# Patient Record
Sex: Female | Born: 1945
Health system: Southern US, Community
[De-identification: ages and names within clinical notes are randomized; demographics above are authoritative.]

## PROBLEM LIST (undated history)

## (undated) DIAGNOSIS — J3081 Allergic rhinitis due to animal (cat) (dog) hair and dander: Secondary | ICD-10-CM

## (undated) DIAGNOSIS — R42 Dizziness and giddiness: Secondary | ICD-10-CM

## (undated) DIAGNOSIS — L719 Rosacea, unspecified: Secondary | ICD-10-CM

## (undated) DIAGNOSIS — C50911 Malignant neoplasm of unspecified site of right female breast: Secondary | ICD-10-CM

## (undated) DIAGNOSIS — M25512 Pain in left shoulder: Secondary | ICD-10-CM

## (undated) DIAGNOSIS — I480 Paroxysmal atrial fibrillation: Secondary | ICD-10-CM

## (undated) DIAGNOSIS — L659 Nonscarring hair loss, unspecified: Secondary | ICD-10-CM

## (undated) DIAGNOSIS — Z87442 Personal history of urinary calculi: Secondary | ICD-10-CM

## (undated) DIAGNOSIS — D179 Benign lipomatous neoplasm, unspecified: Secondary | ICD-10-CM

## (undated) DIAGNOSIS — IMO0002 Reserved for concepts with insufficient information to code with codable children: Secondary | ICD-10-CM

## (undated) DIAGNOSIS — I499 Cardiac arrhythmia, unspecified: Secondary | ICD-10-CM

## (undated) DIAGNOSIS — R87619 Unspecified abnormal cytological findings in specimens from cervix uteri: Secondary | ICD-10-CM

## (undated) DIAGNOSIS — I483 Typical atrial flutter: Secondary | ICD-10-CM

## (undated) DIAGNOSIS — M1990 Unspecified osteoarthritis, unspecified site: Secondary | ICD-10-CM

## (undated) HISTORY — DX: Unspecified abnormal cytological findings in specimens from cervix uteri: R87.619

## (undated) HISTORY — DX: Unspecified osteoarthritis, unspecified site: M19.90

## (undated) HISTORY — DX: Benign lipomatous neoplasm, unspecified: D17.9

## (undated) HISTORY — DX: Pain in left shoulder: M25.512

## (undated) HISTORY — PX: COLONOSCOPY: SHX174

## (undated) HISTORY — DX: Rosacea, unspecified: L71.9

## (undated) HISTORY — DX: Dizziness and giddiness: R42

## (undated) HISTORY — DX: Allergic rhinitis due to animal (cat) (dog) hair and dander: J30.81

## (undated) HISTORY — DX: Malignant neoplasm of unspecified site of right female breast: C50.911

## (undated) HISTORY — DX: Nonscarring hair loss, unspecified: L65.9

## (undated) HISTORY — DX: Paroxysmal atrial fibrillation: I48.0

## (undated) HISTORY — DX: Reserved for concepts with insufficient information to code with codable children: IMO0002

---

## 1986-08-17 HISTORY — PX: GYNECOLOGIC CRYOSURGERY: SHX857

## 1999-10-29 ENCOUNTER — Encounter: Admission: RE | Admit: 1999-10-29 | Discharge: 1999-10-29 | Payer: Self-pay | Admitting: Obstetrics and Gynecology

## 1999-10-29 ENCOUNTER — Encounter: Payer: Self-pay | Admitting: Obstetrics and Gynecology

## 2000-04-13 ENCOUNTER — Other Ambulatory Visit: Admission: RE | Admit: 2000-04-13 | Discharge: 2000-04-13 | Payer: Self-pay | Admitting: Obstetrics and Gynecology

## 2000-04-13 ENCOUNTER — Encounter (INDEPENDENT_AMBULATORY_CARE_PROVIDER_SITE_OTHER): Payer: Self-pay | Admitting: Specialist

## 2000-12-01 ENCOUNTER — Encounter: Payer: Self-pay | Admitting: Obstetrics and Gynecology

## 2000-12-01 ENCOUNTER — Encounter: Admission: RE | Admit: 2000-12-01 | Discharge: 2000-12-01 | Payer: Self-pay | Admitting: Obstetrics and Gynecology

## 2001-12-07 ENCOUNTER — Encounter: Admission: RE | Admit: 2001-12-07 | Discharge: 2001-12-07 | Payer: Self-pay | Admitting: Obstetrics and Gynecology

## 2001-12-07 ENCOUNTER — Encounter: Payer: Self-pay | Admitting: Obstetrics and Gynecology

## 2002-12-13 ENCOUNTER — Encounter: Admission: RE | Admit: 2002-12-13 | Discharge: 2002-12-13 | Payer: Self-pay | Admitting: Obstetrics and Gynecology

## 2002-12-13 ENCOUNTER — Encounter: Payer: Self-pay | Admitting: Obstetrics and Gynecology

## 2003-12-17 ENCOUNTER — Encounter: Admission: RE | Admit: 2003-12-17 | Discharge: 2003-12-17 | Payer: Self-pay | Admitting: Obstetrics and Gynecology

## 2003-12-20 ENCOUNTER — Encounter: Admission: RE | Admit: 2003-12-20 | Discharge: 2003-12-20 | Payer: Self-pay | Admitting: Obstetrics and Gynecology

## 2003-12-31 ENCOUNTER — Encounter (INDEPENDENT_AMBULATORY_CARE_PROVIDER_SITE_OTHER): Payer: Self-pay | Admitting: *Deleted

## 2003-12-31 ENCOUNTER — Encounter: Admission: RE | Admit: 2003-12-31 | Discharge: 2003-12-31 | Payer: Self-pay | Admitting: Surgery

## 2003-12-31 ENCOUNTER — Encounter (INDEPENDENT_AMBULATORY_CARE_PROVIDER_SITE_OTHER): Payer: Self-pay | Admitting: Radiology

## 2004-01-07 ENCOUNTER — Encounter: Admission: RE | Admit: 2004-01-07 | Discharge: 2004-01-07 | Payer: Self-pay | Admitting: Surgery

## 2004-01-08 ENCOUNTER — Encounter (HOSPITAL_COMMUNITY): Admission: RE | Admit: 2004-01-08 | Discharge: 2004-04-07 | Payer: Self-pay | Admitting: Obstetrics and Gynecology

## 2004-01-10 ENCOUNTER — Encounter (INDEPENDENT_AMBULATORY_CARE_PROVIDER_SITE_OTHER): Payer: Self-pay | Admitting: *Deleted

## 2004-01-10 ENCOUNTER — Ambulatory Visit (HOSPITAL_COMMUNITY): Admission: RE | Admit: 2004-01-10 | Discharge: 2004-01-10 | Payer: Self-pay | Admitting: Surgery

## 2004-01-10 HISTORY — PX: BREAST LUMPECTOMY: SHX2

## 2004-01-30 ENCOUNTER — Ambulatory Visit: Admission: RE | Admit: 2004-01-30 | Discharge: 2004-04-29 | Payer: Self-pay | Admitting: Radiation Oncology

## 2004-04-11 DIAGNOSIS — C50911 Malignant neoplasm of unspecified site of right female breast: Secondary | ICD-10-CM

## 2004-04-11 HISTORY — DX: Malignant neoplasm of unspecified site of right female breast: C50.911

## 2004-06-05 ENCOUNTER — Ambulatory Visit: Admission: RE | Admit: 2004-06-05 | Discharge: 2004-06-05 | Payer: Self-pay | Admitting: Radiation Oncology

## 2004-06-27 ENCOUNTER — Ambulatory Visit (HOSPITAL_COMMUNITY): Admission: RE | Admit: 2004-06-27 | Discharge: 2004-06-27 | Payer: Self-pay | Admitting: Surgery

## 2004-07-04 ENCOUNTER — Ambulatory Visit: Payer: Self-pay | Admitting: Oncology

## 2004-11-10 ENCOUNTER — Ambulatory Visit: Payer: Self-pay | Admitting: Oncology

## 2004-12-17 ENCOUNTER — Encounter: Admission: RE | Admit: 2004-12-17 | Discharge: 2004-12-17 | Payer: Self-pay | Admitting: Oncology

## 2005-05-05 ENCOUNTER — Ambulatory Visit: Payer: Self-pay | Admitting: Oncology

## 2005-10-30 ENCOUNTER — Ambulatory Visit: Payer: Self-pay | Admitting: Oncology

## 2005-12-21 ENCOUNTER — Encounter: Admission: RE | Admit: 2005-12-21 | Discharge: 2005-12-21 | Payer: Self-pay | Admitting: Oncology

## 2006-03-01 ENCOUNTER — Encounter: Admission: RE | Admit: 2006-03-01 | Discharge: 2006-03-01 | Payer: Self-pay | Admitting: Oncology

## 2006-04-29 ENCOUNTER — Ambulatory Visit: Payer: Self-pay | Admitting: Oncology

## 2006-05-11 LAB — COMPREHENSIVE METABOLIC PANEL
ALT: 12 U/L (ref 0–40)
AST: 14 U/L (ref 0–37)
CO2: 31 mEq/L (ref 19–32)
Chloride: 101 mEq/L (ref 96–112)
Creatinine, Ser: 0.87 mg/dL (ref 0.40–1.20)
Sodium: 140 mEq/L (ref 135–145)
Total Bilirubin: 0.6 mg/dL (ref 0.3–1.2)
Total Protein: 7 g/dL (ref 6.0–8.3)

## 2006-05-11 LAB — CANCER ANTIGEN 27.29: CA 27.29: 12 U/mL (ref 0–39)

## 2006-05-11 LAB — CBC WITH DIFFERENTIAL/PLATELET
BASO%: 0.5 % (ref 0.0–2.0)
Eosinophils Absolute: 0.2 10*3/uL (ref 0.0–0.5)
MCHC: 34.2 g/dL (ref 32.0–36.0)
MONO#: 0.7 10*3/uL (ref 0.1–0.9)
NEUT#: 3.3 10*3/uL (ref 1.5–6.5)
RBC: 4.48 10*6/uL (ref 3.70–5.32)
RDW: 13.1 % (ref 11.3–14.5)
WBC: 6.1 10*3/uL (ref 3.9–10.0)

## 2006-12-16 ENCOUNTER — Encounter: Admission: RE | Admit: 2006-12-16 | Discharge: 2006-12-16 | Payer: Self-pay | Admitting: Oncology

## 2006-12-29 ENCOUNTER — Encounter: Admission: RE | Admit: 2006-12-29 | Discharge: 2006-12-29 | Payer: Self-pay | Admitting: Oncology

## 2006-12-31 ENCOUNTER — Ambulatory Visit: Payer: Self-pay | Admitting: Oncology

## 2007-01-06 LAB — CBC WITH DIFFERENTIAL/PLATELET
Basophils Absolute: 0 10*3/uL (ref 0.0–0.1)
EOS%: 3.5 % (ref 0.0–7.0)
Eosinophils Absolute: 0.2 10*3/uL (ref 0.0–0.5)
HGB: 14.3 g/dL (ref 11.6–15.9)
LYMPH%: 29.1 % (ref 14.0–48.0)
MCH: 31 pg (ref 26.0–34.0)
MCV: 88.5 fL (ref 81.0–101.0)
MONO%: 9.6 % (ref 0.0–13.0)
NEUT#: 3.7 10*3/uL (ref 1.5–6.5)
Platelets: 173 10*3/uL (ref 145–400)
RBC: 4.6 10*6/uL (ref 3.70–5.32)

## 2007-01-06 LAB — COMPREHENSIVE METABOLIC PANEL
Alkaline Phosphatase: 89 U/L (ref 39–117)
CO2: 28 mEq/L (ref 19–32)
Creatinine, Ser: 0.79 mg/dL (ref 0.40–1.20)
Glucose, Bld: 101 mg/dL — ABNORMAL HIGH (ref 70–99)
Total Bilirubin: 0.5 mg/dL (ref 0.3–1.2)

## 2007-05-03 ENCOUNTER — Ambulatory Visit: Payer: Self-pay | Admitting: Oncology

## 2007-05-05 LAB — COMPREHENSIVE METABOLIC PANEL
ALT: 16 U/L (ref 0–35)
AST: 17 U/L (ref 0–37)
Albumin: 4.6 g/dL (ref 3.5–5.2)
Alkaline Phosphatase: 87 U/L (ref 39–117)
BUN: 17 mg/dL (ref 6–23)
CO2: 29 mEq/L (ref 19–32)
Calcium: 9.8 mg/dL (ref 8.4–10.5)
Glucose, Bld: 82 mg/dL (ref 70–99)
Sodium: 141 mEq/L (ref 135–145)
Total Bilirubin: 0.6 mg/dL (ref 0.3–1.2)

## 2007-05-05 LAB — CBC WITH DIFFERENTIAL/PLATELET
Basophils Absolute: 0 10*3/uL (ref 0.0–0.1)
Eosinophils Absolute: 0.2 10*3/uL (ref 0.0–0.5)
HCT: 41.1 % (ref 34.8–46.6)
HGB: 14.1 g/dL (ref 11.6–15.9)
LYMPH%: 31.3 % (ref 14.0–48.0)
MONO#: 0.6 10*3/uL (ref 0.1–0.9)
NEUT#: 3.5 10*3/uL (ref 1.5–6.5)
NEUT%: 55.5 % (ref 39.6–76.8)
Platelets: 170 10*3/uL (ref 145–400)
WBC: 6.3 10*3/uL (ref 3.9–10.0)

## 2007-05-05 LAB — CANCER ANTIGEN 27.29: CA 27.29: 11 U/mL (ref 0–39)

## 2007-05-12 LAB — ESTRADIOL, ULTRA SENS: Estradiol, Ultra Sensitive: 9 pg/mL

## 2007-11-02 ENCOUNTER — Ambulatory Visit: Payer: Self-pay | Admitting: Oncology

## 2007-11-02 LAB — CBC WITH DIFFERENTIAL/PLATELET
BASO%: 0.7 % (ref 0.0–2.0)
Eosinophils Absolute: 0.2 10*3/uL (ref 0.0–0.5)
LYMPH%: 33.4 % (ref 14.0–48.0)
MCHC: 34.3 g/dL (ref 32.0–36.0)
MONO#: 0.7 10*3/uL (ref 0.1–0.9)
NEUT#: 3.1 10*3/uL (ref 1.5–6.5)
Platelets: 178 10*3/uL (ref 145–400)
RBC: 4.61 10*6/uL (ref 3.70–5.32)
WBC: 6 10*3/uL (ref 3.9–10.0)
lymph#: 2 10*3/uL (ref 0.9–3.3)

## 2007-11-03 LAB — COMPREHENSIVE METABOLIC PANEL
ALT: 15 U/L (ref 0–35)
Albumin: 4.6 g/dL (ref 3.5–5.2)
CO2: 29 mEq/L (ref 19–32)
Calcium: 9.8 mg/dL (ref 8.4–10.5)
Chloride: 101 mEq/L (ref 96–112)
Glucose, Bld: 74 mg/dL (ref 70–99)
Potassium: 4.2 mEq/L (ref 3.5–5.3)
Sodium: 139 mEq/L (ref 135–145)
Total Protein: 7.4 g/dL (ref 6.0–8.3)

## 2007-11-03 LAB — CANCER ANTIGEN 27.29: CA 27.29: 15 U/mL (ref 0–39)

## 2007-11-03 LAB — VITAMIN D 25 HYDROXY (VIT D DEFICIENCY, FRACTURES): Vit D, 25-Hydroxy: 37 ng/mL (ref 30–89)

## 2007-11-09 LAB — VITAMIN D 1,25 DIHYDROXY: Vit D, 1,25-Dihydroxy: 62 pg/mL (ref 6–62)

## 2007-12-08 ENCOUNTER — Other Ambulatory Visit: Admission: RE | Admit: 2007-12-08 | Discharge: 2007-12-08 | Payer: Self-pay | Admitting: Obstetrics & Gynecology

## 2008-01-02 ENCOUNTER — Encounter: Admission: RE | Admit: 2008-01-02 | Discharge: 2008-01-02 | Payer: Self-pay | Admitting: Obstetrics & Gynecology

## 2008-04-30 ENCOUNTER — Ambulatory Visit: Payer: Self-pay | Admitting: Oncology

## 2008-05-02 LAB — COMPREHENSIVE METABOLIC PANEL
Albumin: 4.3 g/dL (ref 3.5–5.2)
Alkaline Phosphatase: 90 U/L (ref 39–117)
BUN: 20 mg/dL (ref 6–23)
CO2: 28 mEq/L (ref 19–32)
Glucose, Bld: 100 mg/dL — ABNORMAL HIGH (ref 70–99)
Potassium: 4 mEq/L (ref 3.5–5.3)
Total Bilirubin: 0.4 mg/dL (ref 0.3–1.2)

## 2008-05-02 LAB — CANCER ANTIGEN 27.29: CA 27.29: 15 U/mL (ref 0–39)

## 2008-05-02 LAB — CBC WITH DIFFERENTIAL/PLATELET
Basophils Absolute: 0 10*3/uL (ref 0.0–0.1)
Eosinophils Absolute: 0.2 10*3/uL (ref 0.0–0.5)
HGB: 13.6 g/dL (ref 11.6–15.9)
LYMPH%: 29.6 % (ref 14.0–48.0)
MCV: 90.5 fL (ref 81.0–101.0)
MONO#: 0.6 10*3/uL (ref 0.1–0.9)
MONO%: 9.1 % (ref 0.0–13.0)
NEUT#: 3.5 10*3/uL (ref 1.5–6.5)
Platelets: 171 10*3/uL (ref 145–400)

## 2008-10-29 ENCOUNTER — Ambulatory Visit: Payer: Self-pay | Admitting: Oncology

## 2008-10-31 LAB — CBC WITH DIFFERENTIAL/PLATELET
BASO%: 0.3 % (ref 0.0–2.0)
Basophils Absolute: 0 10*3/uL (ref 0.0–0.1)
EOS%: 2.2 % (ref 0.0–7.0)
HGB: 14.5 g/dL (ref 11.6–15.9)
MCH: 31 pg (ref 25.1–34.0)
MCHC: 34.5 g/dL (ref 31.5–36.0)
RBC: 4.67 10*6/uL (ref 3.70–5.45)
RDW: 12.2 % (ref 11.2–14.5)
lymph#: 2.1 10*3/uL (ref 0.9–3.3)

## 2008-10-31 LAB — COMPREHENSIVE METABOLIC PANEL
ALT: 19 U/L (ref 0–35)
AST: 21 U/L (ref 0–37)
Albumin: 4.3 g/dL (ref 3.5–5.2)
Calcium: 9.7 mg/dL (ref 8.4–10.5)
Chloride: 101 mEq/L (ref 96–112)
Potassium: 4.2 mEq/L (ref 3.5–5.3)
Sodium: 139 mEq/L (ref 135–145)
Total Protein: 7.2 g/dL (ref 6.0–8.3)

## 2009-01-02 ENCOUNTER — Encounter: Admission: RE | Admit: 2009-01-02 | Discharge: 2009-01-02 | Payer: Self-pay | Admitting: Obstetrics & Gynecology

## 2009-01-18 ENCOUNTER — Encounter: Admission: RE | Admit: 2009-01-18 | Discharge: 2009-01-18 | Payer: Self-pay | Admitting: Obstetrics & Gynecology

## 2009-04-19 ENCOUNTER — Ambulatory Visit: Payer: Self-pay | Admitting: Oncology

## 2009-04-24 LAB — CBC WITH DIFFERENTIAL/PLATELET
Basophils Absolute: 0 10*3/uL (ref 0.0–0.1)
EOS%: 2.9 % (ref 0.0–7.0)
Eosinophils Absolute: 0.2 10*3/uL (ref 0.0–0.5)
HGB: 14.2 g/dL (ref 11.6–15.9)
LYMPH%: 27.3 % (ref 14.0–49.7)
MCV: 90.6 fL (ref 79.5–101.0)
MONO#: 0.6 10*3/uL (ref 0.1–0.9)
MONO%: 8.7 % (ref 0.0–14.0)
Platelets: 169 10*3/uL (ref 145–400)
WBC: 6.4 10*3/uL (ref 3.9–10.3)

## 2009-04-24 LAB — COMPREHENSIVE METABOLIC PANEL
CO2: 30 mEq/L (ref 19–32)
Glucose, Bld: 90 mg/dL (ref 70–99)
Sodium: 138 mEq/L (ref 135–145)
Total Bilirubin: 0.7 mg/dL (ref 0.3–1.2)
Total Protein: 7.5 g/dL (ref 6.0–8.3)

## 2009-04-25 LAB — CANCER ANTIGEN 19-9: CA 19-9: 13.7 U/mL (ref <35.0)

## 2009-04-25 LAB — VITAMIN D 25 HYDROXY (VIT D DEFICIENCY, FRACTURES): Vit D, 25-Hydroxy: 45 ng/mL (ref 30–89)

## 2010-01-06 ENCOUNTER — Encounter: Admission: RE | Admit: 2010-01-06 | Discharge: 2010-01-06 | Payer: Self-pay | Admitting: Oncology

## 2010-01-07 ENCOUNTER — Ambulatory Visit (HOSPITAL_COMMUNITY): Admission: RE | Admit: 2010-01-07 | Discharge: 2010-01-07 | Payer: Self-pay | Admitting: Oncology

## 2010-04-23 ENCOUNTER — Ambulatory Visit: Payer: Self-pay | Admitting: Oncology

## 2010-04-23 LAB — CBC WITH DIFFERENTIAL/PLATELET
BASO%: 1.1 % (ref 0.0–2.0)
Basophils Absolute: 0.1 10*3/uL (ref 0.0–0.1)
EOS%: 3.3 % (ref 0.0–7.0)
HCT: 39.8 % (ref 34.8–46.6)
HGB: 13.3 g/dL (ref 11.6–15.9)
LYMPH%: 29.1 % (ref 14.0–49.7)
MCV: 91.8 fL (ref 79.5–101.0)
NEUT#: 3.1 10*3/uL (ref 1.5–6.5)
NEUT%: 58.1 % (ref 38.4–76.8)
RBC: 4.34 10*6/uL (ref 3.70–5.45)
RDW: 13.2 % (ref 11.2–14.5)
WBC: 5.3 10*3/uL (ref 3.9–10.3)

## 2010-04-23 LAB — COMPREHENSIVE METABOLIC PANEL
AST: 20 U/L (ref 0–37)
Albumin: 3.8 g/dL (ref 3.5–5.2)
Alkaline Phosphatase: 76 U/L (ref 39–117)
Calcium: 9.4 mg/dL (ref 8.4–10.5)
Chloride: 105 mEq/L (ref 96–112)
Sodium: 142 mEq/L (ref 135–145)
Total Protein: 6.7 g/dL (ref 6.0–8.3)

## 2010-04-24 LAB — VITAMIN D 25 HYDROXY (VIT D DEFICIENCY, FRACTURES): Vit D, 25-Hydroxy: 34 ng/mL (ref 30–89)

## 2010-09-06 ENCOUNTER — Other Ambulatory Visit: Payer: Self-pay | Admitting: Oncology

## 2010-09-06 DIAGNOSIS — Z780 Asymptomatic menopausal state: Secondary | ICD-10-CM

## 2010-09-06 DIAGNOSIS — Z9889 Other specified postprocedural states: Secondary | ICD-10-CM

## 2010-09-06 DIAGNOSIS — Z0000 Encounter for general adult medical examination without abnormal findings: Secondary | ICD-10-CM

## 2010-09-07 ENCOUNTER — Encounter: Payer: Self-pay | Admitting: Oncology

## 2011-01-02 NOTE — Op Note (Signed)
NAME:  Cathy Montoya, Cathy Montoya                         ACCOUNT NO.:  [id number]   MEDICAL RECORD NO.:  [id number]                   PATIENT TYPE:  OIB   LOCATION:  2550                                 FACILITY:  [facility]   PHYSICIAN:  Sandria Bales. Ezzard Standing, M.D.               DATE OF BIRTH:  1946-02-20   DATE OF PROCEDURE:  01/10/2004  DATE OF DISCHARGE:                                 OPERATIVE REPORT   PREOPERATIVE DIAGNOSIS:  Carcinoma of the right breast at 8 o'clock  position.   POSTOPERATIVE DIAGNOSIS:  Carcinoma of the right breast at 8 o'clock  position with negative sentinel lymph node biopsy.   PROCEDURE:  Right partial mastectomy, injection of isosulfan blue, right  sentinel lymph node biopsy.   SURGEON:  Sandria Bales. Ezzard Standing, M.D.   ANESTHESIA:  General LMA.   COMPLICATIONS:  None.   INDICATION FOR PROCEDURE:  Ms. Caccamo is a 65 year old white female who has  biopsy-proven carcinoma of the right breast with the primary tumor at 8  o'clock, approximately 1.6 cm in size.  A discussion carried out with the  patient.  The indications and potential complications of surgery and the  options for treatment breast cancer, including mastectomy with and without  reconstruction, and lumpectomy.   She has elected to undergo a lumpectomy of the right breast, planned  sentinel lymph node biopsy.  She understands the possibility of having a  full axillary node dissection.  The indications and potential complications  were explained to the patient.  The potential complications include but are  not limited to bleeding, infection, nerve injury, lymphedema, and recurrent  of the tumor.   The patient placed in the supine position, had already been to radiology,  where she had her right breast injected with a radioisotope.  I then  injected about 1 mL of isosulfan blue in the subareolar space.  I then used  an ultrasound to again identify the tumor, which was actually palpable at  the 8 o'clock position  of the right breast off the edge of the main breast  mass.  I then prepped her right breast and arm with Betadine solution and  sterilely draped her.  I first went up to the sentinel lymph node, in which  I identified a hot area immediately behind the pectoralis major at the upper  end of the breast tissue, cut down and followed a lymphatic duct into a  pretty good-sized lymph node.  It was probably about 1 x 2 cm, blue, and had  counts of 3000 with a background of about 10.  The sentinel node was sent  off.  Dr. Tammi Sou read touch preps as being negative but did raise the  specter of a possible lobular carcinoma, though he said he could not call it  clearly malignant.   I then turned my attention to the primary tumor.  I tried to make sure I  had  at least a 1 cm margin on all sides.  The tumor, again, was at the 8 o'clock  position.  I got an ellipse of skin.  I tried to get 1-2 cm around the  entire mass.  I took out a pretty good piece of her lower outer aspect of  her left breast.  I carried my dissection down to the chest wall, so that  was my deep margin.  I marked the specimen inferiorly with a marker,  medially with a marker, superiorly with a marker.  Touch preps by Dr. Debby Bud  said there was no gross tumor.  He thought my closest margin was actually my  deep margin, which was approximately a centimeter in size.  I then irrigated  both wounds out.  I marked the cavity with baby blue clips.  I then closed  the skin with interrupted 3-0 Vicryl sutures and then 5-0 Vicryl sutures in  the skin, painted each wound with tincture of Benzoin and steri-stripped  them.   The patient tolerated the procedure well, was transported to the recovery  room in good condition.  Sponge and needle count were correct at the end of  the case.  Again, she had a negative sentinel lymph node and negative  margins.  Final pathology is pending at the time of dictation.                                                Sandria Bales. Ezzard Standing, M.D.    DHN/MEDQ  D:  01/10/2004  T:  01/11/2004  Job:  [id number]   cc:   Doristine Mango, M.D.   Katherine Roan, M.D.  [address]  [place]  [place] [address]  Fax: [phone]   Valentino Hue. Magrinat, M.D.  [address] [facility]  [place]  [place] [address]  Fax: [phone]   Cheral Marker, M.D.

## 2011-01-02 NOTE — Op Note (Signed)
NAME:  Cathy Montoya, Cathy Montoya NO.:  [id number]   MEDICAL RECORD NO.:  [id number]          PATIENT TYPE:  AMB   LOCATION:  ENDO                         FACILITY:  [facility]   PHYSICIAN:  Sandria Bales. Ezzard Standing, M.D.  DATE OF BIRTH:  1945-10-15   DATE OF PROCEDURE:  06/27/2004  DATE OF DISCHARGE:                                 OPERATIVE REPORT   PREOPERATIVE DIAGNOSIS:  History of breast cancer, age > 84, for evaluation  of colon.   POSTOPERATIVE DIAGNOSIS:  Normal colon without polyps, growth, or  diverticula.   PROCEDURE:  Flexible colonoscopy.   SURGEON:  Sandria Bales. Ezzard Standing, M.D.  No first assistant.   ANESTHESIA:  5 mg of Versed, 50 mg of Demerol.   COMPLICATIONS:  None.   INDICATION FOR PROCEDURE:  [name] is a 65 year old white female who has  had a right breast lumpectomy for breast cancer.  Because of her age and her  history of breast cancer, she now comes for screening colonoscopy.   The indications and potential complications of the procedure were explained  to the patient.  Potential complications include but not limited to bowel  perforation, bleeding.  She completed a bowel prep at home; however, last  night she ate salad because she had not been told she should not eat any  other liquids and I was unsure at the beginning of the procedure whether  this would compromise examination of the lumen of her bowel or not.   She was placed in a left lateral decubitus position.  She had an IV in her  right hand.  She was given nasal oxygen, had an EKG in place, blood pressure  cuff, and pulse oximeter during the procedure.   OPERATIVE NOTE:  The pediatric Olympus scope was advanced without difficulty  through her sigmoid colon, left colon, and transverse colon.  At the hepatic  flexure I had a little trouble advancing the scope and had to roll her both  on her back and on her right side but did get the scope to advance into the  cecum.  I visualized the appendiceal  orifice.  She did have a stool ball in  her cecum, but I was able to roll her around enough to see around this well  enough.  She had some fluid in the remainder of her colon but despite having  eaten last night, I would say her bowel prep was good enough that I saw the  lumen of the bowel well in both right colon, transverse colon, left colon,  and sigmoid colon and rectum.   The scope was then retroflexed with in the rectum, and I see she had maybe a  small internal hemorrhoids but no other mass or lesion, and digital rectal  exam was also unremarkable.   [name] basically had a normal colonoscopy.  Unless she should have some  change in her bowel habits or something other significant, probably will not  need another colonoscopy for 10 years' time.      Davi   DHN/MEDQ  D:  06/27/2004  T:  06/28/2004  Job:  [id number]   cc:   S. Kyra Manges, M.D.  [phone] [address]  [place]  [place] [address]  Fax: [phone]   Valentino Hue. Magrinat, M.D.  [address] [facility]  [place]  [place] [address]  Fax: [phone]   Maryln Gottron, M.D.  [address] [facility]  [place]  [place] [address]  Fax: [phone]

## 2011-01-08 ENCOUNTER — Ambulatory Visit
Admission: RE | Admit: 2011-01-08 | Discharge: 2011-01-08 | Disposition: A | Discharge status: Home / Self Care | Payer: BC Managed Care – PPO | Source: Ambulatory Visit | Attending: Oncology | Admitting: Oncology

## 2011-01-08 ENCOUNTER — Other Ambulatory Visit: Payer: Self-pay

## 2011-01-08 DIAGNOSIS — Z9889 Other specified postprocedural states: Secondary | ICD-10-CM

## 2011-01-09 ENCOUNTER — Other Ambulatory Visit: Payer: Self-pay

## 2011-01-09 ENCOUNTER — Ambulatory Visit
Admission: RE | Admit: 2011-01-09 | Discharge: 2011-01-09 | Disposition: A | Discharge status: Home / Self Care | Payer: BC Managed Care – PPO | Source: Ambulatory Visit | Attending: Oncology | Admitting: Oncology

## 2011-01-09 DIAGNOSIS — Z780 Asymptomatic menopausal state: Secondary | ICD-10-CM

## 2011-01-15 ENCOUNTER — Other Ambulatory Visit: Payer: Self-pay | Admitting: Dermatology

## 2011-03-06 ENCOUNTER — Other Ambulatory Visit: Payer: Self-pay | Admitting: Dermatology

## 2011-05-05 ENCOUNTER — Other Ambulatory Visit: Payer: Self-pay | Admitting: Oncology

## 2011-05-05 ENCOUNTER — Encounter (HOSPITAL_BASED_OUTPATIENT_CLINIC_OR_DEPARTMENT_OTHER): Payer: BC Managed Care – PPO | Admitting: Oncology

## 2011-05-05 DIAGNOSIS — C50419 Malignant neoplasm of upper-outer quadrant of unspecified female breast: Secondary | ICD-10-CM

## 2011-05-05 DIAGNOSIS — Z170 Estrogen receptor positive status [ER+]: Secondary | ICD-10-CM

## 2011-05-05 LAB — COMPREHENSIVE METABOLIC PANEL
AST: 17 U/L (ref 0–37)
Albumin: 3.6 g/dL (ref 3.5–5.2)
BUN: 23 mg/dL (ref 6–23)
Calcium: 9.8 mg/dL (ref 8.4–10.5)
Chloride: 101 mEq/L (ref 96–112)
Glucose, Bld: 97 mg/dL (ref 70–99)
Potassium: 3.9 mEq/L (ref 3.5–5.3)
Sodium: 139 mEq/L (ref 135–145)
Total Protein: 7.1 g/dL (ref 6.0–8.3)

## 2011-05-05 LAB — CBC WITH DIFFERENTIAL/PLATELET
Basophils Absolute: 0 10*3/uL (ref 0.0–0.1)
Eosinophils Absolute: 0.2 10*3/uL (ref 0.0–0.5)
HGB: 14 g/dL (ref 11.6–15.9)
NEUT#: 3.7 10*3/uL (ref 1.5–6.5)
RBC: 4.53 10*6/uL (ref 3.70–5.45)
RDW: 13.2 % (ref 11.2–14.5)
WBC: 6.2 10*3/uL (ref 3.9–10.3)
lymph#: 1.7 10*3/uL (ref 0.9–3.3)

## 2011-05-05 LAB — VITAMIN D 25 HYDROXY (VIT D DEFICIENCY, FRACTURES): Vit D, 25-Hydroxy: 38 ng/mL (ref 30–89)

## 2011-05-12 ENCOUNTER — Other Ambulatory Visit: Payer: Self-pay | Admitting: Oncology

## 2011-05-12 ENCOUNTER — Encounter (HOSPITAL_BASED_OUTPATIENT_CLINIC_OR_DEPARTMENT_OTHER): Payer: BC Managed Care – PPO | Admitting: Oncology

## 2011-05-12 DIAGNOSIS — C50419 Malignant neoplasm of upper-outer quadrant of unspecified female breast: Secondary | ICD-10-CM

## 2011-05-12 DIAGNOSIS — Z170 Estrogen receptor positive status [ER+]: Secondary | ICD-10-CM

## 2011-05-12 DIAGNOSIS — M899 Disorder of bone, unspecified: Secondary | ICD-10-CM

## 2011-05-12 DIAGNOSIS — Z1231 Encounter for screening mammogram for malignant neoplasm of breast: Secondary | ICD-10-CM

## 2012-01-11 ENCOUNTER — Ambulatory Visit: Payer: BC Managed Care – PPO

## 2012-01-12 ENCOUNTER — Ambulatory Visit
Admission: RE | Admit: 2012-01-12 | Discharge: 2012-01-12 | Disposition: A | Discharge status: Home / Self Care | Payer: Medicare Other | Source: Ambulatory Visit | Attending: Oncology | Admitting: Oncology

## 2012-01-12 DIAGNOSIS — Z1231 Encounter for screening mammogram for malignant neoplasm of breast: Secondary | ICD-10-CM

## 2012-04-16 ENCOUNTER — Telehealth: Payer: Self-pay | Admitting: Oncology

## 2012-04-16 NOTE — Telephone Encounter (Signed)
S/w pt husband re appts for 10/2 and 10/9.

## 2012-05-12 ENCOUNTER — Telehealth: Payer: Self-pay | Admitting: Oncology

## 2012-05-12 NOTE — Telephone Encounter (Signed)
lmonvm advisng the pt of her cancelled oct appts that have been r/s to nov due to a change in the md's schedule

## 2012-05-18 ENCOUNTER — Other Ambulatory Visit: Payer: Medicare Other

## 2012-05-25 ENCOUNTER — Ambulatory Visit: Payer: Medicare Other | Admitting: Oncology

## 2012-07-11 ENCOUNTER — Other Ambulatory Visit (HOSPITAL_BASED_OUTPATIENT_CLINIC_OR_DEPARTMENT_OTHER): Payer: 59 | Admitting: Lab

## 2012-07-11 DIAGNOSIS — C50419 Malignant neoplasm of upper-outer quadrant of unspecified female breast: Secondary | ICD-10-CM

## 2012-07-11 LAB — COMPREHENSIVE METABOLIC PANEL (CC13)
BUN: 19 mg/dL (ref 7.0–26.0)
CO2: 31 mEq/L — ABNORMAL HIGH (ref 22–29)
Calcium: 10.2 mg/dL (ref 8.4–10.4)
Chloride: 104 mEq/L (ref 98–107)
Creatinine: 0.8 mg/dL (ref 0.6–1.1)
Glucose: 91 mg/dl (ref 70–99)

## 2012-07-11 LAB — CBC WITH DIFFERENTIAL/PLATELET
Basophils Absolute: 0.1 10*3/uL (ref 0.0–0.1)
HCT: 43.2 % (ref 34.8–46.6)
HGB: 14.5 g/dL (ref 11.6–15.9)
LYMPH%: 27.5 % (ref 14.0–49.7)
MONO#: 0.7 10*3/uL (ref 0.1–0.9)
NEUT%: 56.5 % (ref 38.4–76.8)
Platelets: 171 10*3/uL (ref 145–400)
WBC: 6.1 10*3/uL (ref 3.9–10.3)
lymph#: 1.7 10*3/uL (ref 0.9–3.3)

## 2012-07-11 LAB — CANCER ANTIGEN 27.29: CA 27.29: 16 U/mL (ref 0–39)

## 2012-07-18 ENCOUNTER — Ambulatory Visit: Payer: Medicare Other | Admitting: Oncology

## 2012-07-27 ENCOUNTER — Encounter: Payer: Self-pay | Admitting: Physician Assistant

## 2012-07-27 ENCOUNTER — Ambulatory Visit (HOSPITAL_BASED_OUTPATIENT_CLINIC_OR_DEPARTMENT_OTHER): Payer: 59 | Admitting: Physician Assistant

## 2012-07-27 VITALS — BP 155/77 | HR 64 | Temp 98.0°F | Resp 20 | Wt 158.0 lb

## 2012-07-27 DIAGNOSIS — C50911 Malignant neoplasm of unspecified site of right female breast: Secondary | ICD-10-CM

## 2012-07-27 DIAGNOSIS — Z853 Personal history of malignant neoplasm of breast: Secondary | ICD-10-CM

## 2012-07-27 NOTE — Progress Notes (Signed)
ID: Cathy Montoya   DOB: 18-Jan-1946  MR#: [id number]  CSN#:[id number]  PCP: Cala Bradford, MD GYN: Lum Keas, MD SU:  OTHER MD: Venancio Poisson, MD   HISTORY OF PRESENT ILLNESS: Cathy Montoya had a suspicious breast mass noted on mammogram obtained Dec 17, 2003.  Additional views confirmed the presence of an irregular area of distortion and spiculation and by ultrasound, this measured up to 1.2 cm.  Accordingly, biopsy of this lesion was obtained on 12/31/03 and showed ([id number]) an invasive mammary carcinoma which was ER positive at 15%, PR positive at 23%, HER-2/neu negative at 1+.  With this information, the patient was referred to Dr. Ovidio Kin and after appropriate discussion, she proceeded to right lumpectomy with sentinel lymph node biopsy Jan 10, 2004.  The final pathology ([id number]) showed a 1.8 cm infiltrating lobular carcinoma, with negative margins.  No evidence of lymphovascular invasion.  Zero of one lymph nodes involved, although there were isolated tumor cells in that lymph node.  She underwent one cycle of CMF, then declined additional chemotherapy. She received radiation therapy. She was on exemestane from September of 2005 until September of 2010 according to the MA 27 protocol.  INTERVAL HISTORY: Else returns today for routine followup of her right breast carcinoma. Interval history is generally unremarkable. 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  REVIEW OF SYSTEMS: Physically,[name] had few complaints today. She continues to have some alopecia followed by her dermatologist. She's had no recent illnesses and denies fevers or chills. She has only occasional hot flashes, nothing problematic. No vaginal bleeding and no significant vaginal dryness. She's eating and drinking well with no nausea or change in bowel or bladder habits. She has no cough,  shortness of breath, or chest pain. She denies abnormal headaches and has had no unusual myalgias, arthralgias, bony pain, or peripheral swelling. She has some occasional arthritic pain in the left hip.  A detailed review of systems is otherwise noncontributory.   PAST MEDICAL HISTORY: Past Medical History  Diagnosis Date  . Breast cancer, right 07/27/2012  Significant for a possible diagnosis of juvenile rheumatoid arthritis.  The patient was diagnosed at age 66.  It is really unclear what all this represented.  It may be that she had positive markers at the time, but what she actually had clinically was a severe overall weakness. She was treated with aspirin and "another drug" for one year, and has not required any further interventions.   Other problems include a heart murmur noted during her pregnancy and status post cryotherapy to the cervix remotely.      PAST SURGICAL HISTORY: No past surgical history on file.  FAMILY HISTORY No family history on file. The patient's father is alive at age 80, status post CVA.  The patient's mother is alive at age 41 with possible Alzheimer's.  They both live in [place].  The patient has an older sister with a history of breast cancer, status post mastectomy.  I believe she was diagnosed at age 65.  The patient's paternal grandmother also had breast cancer.  The patient's paternal aunt died of breast cancer in her late sixties and that particular aunt's daughter, the patient's first cousin, was diagnosed with breast cancer in her twenties.  GYNECOLOGIC HISTORY: She is status post change of life, approximately 2003, when she last had a menstrual period.  SOCIAL HISTORY:  She is aretired grade Engineer, site.  Her husband, Cathy Montoya, works as a Human resources officer for a [employer]  They have two daughters, one of whom is a Engineer, civil (consulting) in [place].  The other one is a [employer] in [place].    ADVANCED DIRECTIVES:  HEALTH  MAINTENANCE: History  Substance Use Topics  . Smoking status: Never Smoker   . Smokeless tobacco: Never Used  . Alcohol Use: No     Colonoscopy: UTD  PAP: UTD, Dr. Hyacinth Meeker  Bone density:  01/09/2011, Normal  Lipid panel:  No Known Allergies  Current Outpatient Prescriptions  Medication Sig Dispense Refill  . aspirin 81 MG tablet Take 81 mg by mouth daily.      . Azelaic Acid (FINACEA EX) Apply topically as needed.      . Calcium Carbonate-Vitamin D (CALCIUM-VITAMIN D) 500-200 MG-UNIT per tablet Take 1 tablet by mouth 2 (two) times daily with a meal.      . cholecalciferol (VITAMIN D) 400 UNITS TABS Take 1,000 Units by mouth daily.      . clobetasol cream (TEMOVATE) 0.05 % Apply 1 application topically 2 (two) times daily.      . Multiple Vitamin (MULTIVITAMIN) capsule Take 1 capsule by mouth daily.        OBJECTIVE: Middle-aged white female who appears comfortable and in no acute distress Filed Vitals:   07/27/12 1034  BP: 155/77  Pulse: 64  Temp: 98 F (36.7 C)  Resp: 20     There is no height on file to calculate BMI.    ECOG FS: 0 Filed Weights   07/27/12 1034  Weight: 158 lb (71.668 kg)    Sclerae unicteric Oropharynx clear No cervical or supraclavicular adenopathy Lungs no rales or rhonchi Heart regular rate and rhythm Abdomen soft, nontender, with positive bowel sounds MSK no focal spinal tenderness, no peripheral edema Neuro: nonfocal, alert and oriented x3 Breasts: Right breast is status post lumpectomy with no suspicious nodularity or evidence of local recurrence. Left breast is unremarkable. Axillae are benign bilaterally, no adenopathy   LAB RESULTS: Lab Results  Component Value Date   WBC 6.1 07/11/2012   NEUTROABS 3.4 07/11/2012   HGB 14.5 07/11/2012   HCT 43.2 07/11/2012   MCV 92.1 07/11/2012   PLT 171 07/11/2012      Chemistry      Component Value Date/Time   NA 140 07/11/2012 1037   NA 139 05/05/2011 1610   K 4.8 07/11/2012 1037   K 3.9  05/05/2011 1610   CL 104 07/11/2012 1037   CL 101 05/05/2011 1610   CO2 31* 07/11/2012 1037   CO2 33* 05/05/2011 1610   BUN 19.0 07/11/2012 1037   BUN 23 05/05/2011 1610   CREATININE 0.8 07/11/2012 1037   CREATININE 0.71 05/05/2011 1610      Component Value Date/Time   CALCIUM 10.2 07/11/2012 1037   CALCIUM 9.8 05/05/2011 1610   ALKPHOS 79 07/11/2012 1037   ALKPHOS 77 05/05/2011 1610   AST 20 07/11/2012 1037   AST 17 05/05/2011 1610   ALT 20 07/11/2012 1037   ALT 15 05/05/2011 1610   BILITOT 0.70 07/11/2012 1037   BILITOT 0.3 05/05/2011 1610       Lab Results  Component Value Date   LABCA2 16 07/11/2012     STUDIES: Most recent mammogram on 01/12/2012 was unremarkable. Most recent bone density on 01/09/2011 was normal.  ASSESSMENT: 66 y.o.  [place] woman,   (1)  status  post right lumpectomy with sentinel node procedure in May 2005 for a T1c N0 grade 1 infiltrating lobular carcinoma.  Estrogen receptor/progesterone receptor positive and HER-2/neu negative with MIB-1 of  9%.   (2)   Received 1 cycle of CMF at which point she decided not to continue with further chemotherapy due to her low risk of tumor recurrence.    (3)  Status post radiation.    (4)  On exemestane from September 2005 until September 2010 according to the MA27 protocol now followed with observation alone on an annual basis  PLAN: This case was reviewed with Dr. Darnelle Catalan who also spoke with the patient today. At this time we are releasing her from followup through our clinic. She has done extremely well with regards to her breast cancer, and of course understands that we will be glad to see her at any time in the future if necessary.   Otherwise we will release her back to her primary care physician, Dr. Laurann Montana, and her gynecologist, Dr. Leda Quail, for routine followup.  Patient voices understanding and agreement with this plan, and call any changes or problems.   Kensli Bowley    07/27/2012

## 2012-12-05 ENCOUNTER — Other Ambulatory Visit: Payer: Self-pay

## 2012-12-05 DIAGNOSIS — Z1231 Encounter for screening mammogram for malignant neoplasm of breast: Secondary | ICD-10-CM

## 2013-01-23 ENCOUNTER — Ambulatory Visit
Admission: RE | Admit: 2013-01-23 | Discharge: 2013-01-23 | Disposition: A | Discharge status: Home / Self Care | Payer: Medicare Other | Source: Ambulatory Visit

## 2013-01-23 DIAGNOSIS — Z1231 Encounter for screening mammogram for malignant neoplasm of breast: Secondary | ICD-10-CM

## 2013-06-13 ENCOUNTER — Encounter: Payer: Self-pay | Admitting: Obstetrics & Gynecology

## 2013-06-15 ENCOUNTER — Encounter: Payer: Self-pay | Admitting: Obstetrics & Gynecology

## 2013-06-15 ENCOUNTER — Ambulatory Visit (INDEPENDENT_AMBULATORY_CARE_PROVIDER_SITE_OTHER): Payer: Medicare Other | Admitting: Obstetrics & Gynecology

## 2013-06-15 VITALS — BP 120/78 | HR 64 | Resp 16 | Ht 68.75 in | Wt 156.8 lb

## 2013-06-15 DIAGNOSIS — Z01419 Encounter for gynecological examination (general) (routine) without abnormal findings: Secondary | ICD-10-CM

## 2013-06-15 DIAGNOSIS — J3489 Other specified disorders of nose and nasal sinuses: Secondary | ICD-10-CM

## 2013-06-15 NOTE — Progress Notes (Signed)
67 y.o. G2P2 MarriedCaucasianF here for annual exam.  Doing well.  Released from oncology.  No vaginal bleeding. Feeling a bump within nose, left.  Would like ENT referral.  Patient's last menstrual period was 08/17/1998.          Sexually active: yes  The current method of family planning is none.    Exercising: yes  walking dogs Smoker:  no  Health Maintenance: Pap:  05/09/12 WNL History of abnormal Pap:  Yes years ago MMG:  01/23/13 3D normal Colonoscopy:  2006 repeat in 10 years BMD:   5/12 normal TDaP:  2012 Screening Labs: Dr. Cliffton Asters, Hb today: n/a, Urine today: WBC-trace   reports that she has never smoked. She has never used smokeless tobacco. She reports that she drinks alcohol. She reports that she does not use illicit drugs.  Past Medical History  Diagnosis Date  . Breast cancer, right 07/27/2012  . Lipoma   . Alopecia     Past Surgical History  Procedure Laterality Date  . Breast lumpectomy      lymph node, chemo x 1/radiation    Current Outpatient Prescriptions  Medication Sig Dispense Refill  . aspirin 81 MG tablet Take 81 mg by mouth daily.      . Calcium Carbonate-Vitamin D (CALCIUM-VITAMIN D) 500-200 MG-UNIT per tablet Take 1 tablet by mouth 2 (two) times daily with a meal.      . clobetasol (TEMOVATE) 0.05 % external solution Apply 1 application topically 2 (two) times daily.      . fish oil-omega-3 fatty acids 1000 MG capsule Take 2 g by mouth daily.      . Multiple Vitamin (MULTIVITAMIN) capsule Take 1 capsule by mouth daily.      Marland Kitchen tretinoin (RETIN-A) 0.05 % cream May have been changed again to new medication       No current facility-administered medications for this visit.    Family History  Problem Relation Age of Onset  . Mental retardation Sister   . Breast cancer Sister 28  . Breast cancer Paternal Grandmother   . Cancer Maternal Grandmother     unknown type  . Breast cancer Other     aunt  . Cancer Other     aunt, unknown type-had  hysterectomy  . Hypertension Father   . Thyroid disease Daughter     cyst on thyroid-removed partial thyroid  . Thyroid disease Mother     ROS:  Pertinent items are noted in HPI.  Otherwise, a comprehensive ROS was negative.  Exam:   BP 120/78  Pulse 64  Resp 16  Ht 5' 8.75" (1.746 m)  Wt 156 lb 12.8 oz (71.124 kg)  BMI 23.33 kg/m2  LMP 08/17/1998  Weight change: -2lb   Height: 5' 8.75" (174.6 cm)  Ht Readings from Last 3 Encounters:  06/15/13 5' 8.75" (1.746 m)    General appearance: alert, cooperative and appears stated age Head: Normocephalic, without obvious abnormality, atraumatic Neck: no adenopathy, supple, symmetrical, trachea midline and thyroid normal to inspection and palpation Lungs: clear to auscultation bilaterally Breasts: normal appearance, no masses or tenderness Heart: regular rate and rhythm Abdomen: soft, non-tender; bowel sounds normal; no masses,  no organomegaly Extremities: extremities normal, atraumatic, no cyanosis or edema Skin: Skin color, texture, turgor normal. No rashes or lesions Lymph nodes: Cervical, supraclavicular, and axillary nodes normal. No abnormal inguinal nodes palpated Neurologic: Grossly normal   Pelvic: External genitalia:  no lesions  Urethra:  normal appearing urethra with no masses, tenderness or lesions              Bartholins and Skenes: normal                 Vagina: normal appearing vagina with normal color and discharge, no lesions              Cervix: no lesions              Pap taken: no Bimanual Exam:  Uterus:  normal size, contour, position, consistency, mobility, non-tender              Adnexa: normal adnexa and no mass, fullness, tenderness               Rectovaginal: Confirms               Anus:  normal sphincter tone, no lesions  A:  Well Woman with normal exam Internal nasal lesion, requests referral H/O breast cancer 4/09, released from oncology Frontal alopecia Family hx of breast cancer in  sister and PGM  P:   Mammogram yearly.  3D recommended. pap smear last year.  No Pap today. return annually or prn  An After Visit Summary was printed and given to the patient.

## 2013-06-15 NOTE — Patient Instructions (Addendum)

## 2013-06-27 ENCOUNTER — Telehealth: Payer: Self-pay | Admitting: Obstetrics & Gynecology

## 2013-06-27 NOTE — Telephone Encounter (Signed)
Pt says she's waiting on someone to call her with a referral to an ear,nose and throat doctor.

## 2013-06-27 NOTE — Telephone Encounter (Signed)
Referral has been sent.  S/W Dr. Avel Sensor office, she can call to make initial appointment. [phone] Message left to return call to Graham at [phone].

## 2013-12-18 ENCOUNTER — Other Ambulatory Visit: Payer: Self-pay

## 2013-12-18 DIAGNOSIS — Z1231 Encounter for screening mammogram for malignant neoplasm of breast: Secondary | ICD-10-CM

## 2014-01-30 ENCOUNTER — Ambulatory Visit
Admission: RE | Admit: 2014-01-30 | Discharge: 2014-01-30 | Disposition: A | Discharge status: Home / Self Care | Payer: 59 | Source: Ambulatory Visit

## 2014-01-30 DIAGNOSIS — Z1231 Encounter for screening mammogram for malignant neoplasm of breast: Secondary | ICD-10-CM

## 2014-06-18 ENCOUNTER — Encounter: Payer: Self-pay | Admitting: Obstetrics & Gynecology

## 2014-07-09 ENCOUNTER — Telehealth: Payer: Self-pay | Admitting: Obstetrics & Gynecology

## 2014-07-09 NOTE — Telephone Encounter (Signed)
lmtcb to reschedule cancelled aex with Dr. Sabra Heck for 07/16/14.

## 2014-07-16 ENCOUNTER — Ambulatory Visit: Payer: Medicare Other | Admitting: Obstetrics & Gynecology

## 2014-08-28 ENCOUNTER — Encounter: Payer: Self-pay | Admitting: Nurse Practitioner

## 2014-08-28 ENCOUNTER — Ambulatory Visit (INDEPENDENT_AMBULATORY_CARE_PROVIDER_SITE_OTHER): Payer: Medicare Other | Admitting: Nurse Practitioner

## 2014-08-28 VITALS — BP 140/72 | HR 60 | Ht 68.5 in | Wt 156.0 lb

## 2014-08-28 DIAGNOSIS — Z0000 Encounter for general adult medical examination without abnormal findings: Secondary | ICD-10-CM

## 2014-08-28 DIAGNOSIS — Z01419 Encounter for gynecological examination (general) (routine) without abnormal findings: Secondary | ICD-10-CM

## 2014-08-28 DIAGNOSIS — Z1211 Encounter for screening for malignant neoplasm of colon: Secondary | ICD-10-CM

## 2014-08-28 DIAGNOSIS — E559 Vitamin D deficiency, unspecified: Secondary | ICD-10-CM

## 2014-08-28 DIAGNOSIS — M8580 Other specified disorders of bone density and structure, unspecified site: Secondary | ICD-10-CM

## 2014-08-28 NOTE — Progress Notes (Signed)
08/17/1998 Patient ID: Cathy Montoya, female   DOB: 29-Nov-1945, 69 y.o.   MRN: [id number] 69 y.o. G2P2 Married  Caucasian Fe here for annual exam.    Patient's last menstrual period was 08/17/1998.          Sexually active: yes  The current method of family planning is none and postmenopausal.  Exercising: yes walking dogs Smoker: no  Health Maintenance: Pap: 05/09/12 WNL History of abnormal Pap: Yes, cryosurgery for cervical dysplasia 1988 MMG: 01/23/13, 3D, Bi-Rads 1:  Negative  Colonoscopy: 2005 repeat in 10 years - due now BMD: 01/09/11, -0.6S/-0.9L TDaP:  07/2012 Shingles: given at 13 Labs:  HB:  PCP   Urine:  PCP   reports that she has never smoked. She has never used smokeless tobacco. She reports that she drinks alcohol. She reports that she does not use illicit drugs.  Past Medical History  Diagnosis Date  . Lipoma   . Alopecia     frontal  . Abnormal Pap smear   . Breast cancer, right 04/11/2004    invasive lobular carcinoma treated with 1 chemo treatment and radiation    Past Surgical History  Procedure Laterality Date  . Gynecologic cryosurgery  1988    cervical dysplasia  . Breast lumpectomy  01/10/2004    lymph node, chemo x 1/radiation    Current Outpatient Prescriptions  Medication Sig Dispense Refill  . aspirin 81 MG tablet Take 81 mg by mouth daily.    . Cholecalciferol (VITAMIN D3) 400 UNITS CAPS Take 1 capsule by mouth daily.    . clobetasol (TEMOVATE) 0.05 % external solution Apply 1 application topically 2 (two) times daily.    Marland Kitchen glucosamine-chondroitin 500-400 MG tablet Take 1 tablet by mouth daily.    Marland Kitchen MIRVASO 0.33 % GEL Apply topically as directed.  0  . Multiple Vitamin (MULTIVITAMIN) capsule Take 1 capsule by mouth daily.    Marland Kitchen OVER THE COUNTER MEDICATION Apply topically as directed. HQRA Plus with 1% tretinoin; dermatology product    . tretinoin (RETIN-A) 0.05 % cream May have been changed again to new medication    . Calcium  Carbonate-Vitamin D (CALCIUM-VITAMIN D) 500-200 MG-UNIT per tablet Take 1 tablet by mouth 2 (two) times daily with a meal.     No current facility-administered medications for this visit.    @FAM  HX@  ROS:  Pertinent items are noted in HPI.  Otherwise, a comprehensive ROS was negative.  Exam:   BP 140/72 mmHg  Pulse 60  Ht 5' 8.5" (1.74 m)  Wt 156 lb (70.761 kg)  BMI 23.37 kg/m2  LMP 08/17/1998 Height: 5' 8.5" (174 cm) @LAST  HT(3)@  General appearance: alert, cooperative and appears stated age Head: Normocephalic, without obvious abnormality, atraumatic Neck: no adenopathy, supple, symmetrical, trachea midline and thyroid normal to inspection and palpation Lungs: clear to auscultation bilaterally Breasts: normal appearance, no masses or tenderness, on the left, with surgical changes on the right.  No new mass Heart: regular rate and rhythm Abdomen: soft, non-tender; no masses,  no organomegaly Extremities: extremities normal, atraumatic, no cyanosis or edema Skin: Skin color, texture, turgor normal. No rashes or lesions Lymph nodes: Cervical, supraclavicular, and axillary nodes normal. No abnormal inguinal nodes palpated Neurologic: Grossly normal   Pelvic: External genitalia:  no lesions              Urethra:  normal appearing urethra with no masses, tenderness or lesions  Bartholin's and Skene's: normal                 Vagina: normal appearing vagina with normal color and discharge, no lesions              Cervix: anteverted              Pap taken: Yes.   Bimanual Exam:  Uterus:  normal size, contour, position, consistency, mobility, non-tender              Adnexa: no mass, fullness, tenderness               Rectovaginal: Confirms               Anus:  normal sphincter tone, no lesions  Chaperone present: Yes/NO  A:  Well Woman with normal exam  H/O breast cancer 4/09, released from oncology  Family hx of breast cancer in sister and PGM  Low normal BMD -  will repeat BMD - at risk with radiation treatment for breast cancer  Vit D deficiency  History of cervical dysplasia 1988 treated with cryotherapy   P:   Reviewed health and wellness pertinent to exam  Pap smear taken today  Mammogram is due 6/22015  Order for BMD  IFOB is given - may not do colonoscopy until the fall  Counseled on breast self exam, mammography screening, adequate intake of calcium and vitamin D, diet and exercise, Kegel's exercises return annually or prn  An After Visit Summary was printed and given to the patient.

## 2014-08-28 NOTE — Patient Instructions (Addendum)

## 2014-08-28 NOTE — Progress Notes (Signed)
Encounter reviewed by Dr. Brook Silva.  

## 2014-08-29 LAB — VITAMIN D 25 HYDROXY (VIT D DEFICIENCY, FRACTURES): Vit D, 25-Hydroxy: 34 ng/mL (ref 30–100)

## 2014-08-30 LAB — IPS PAP SMEAR ONLY

## 2014-09-05 LAB — FECAL OCCULT BLOOD, IMMUNOCHEMICAL: IFOBT: NEGATIVE

## 2014-09-05 NOTE — Addendum Note (Signed)
Addended by: Elroy Channel on: 09/05/2014 01:57 PM   Modules accepted: Orders, SmartSet

## 2014-12-24 ENCOUNTER — Other Ambulatory Visit: Payer: Self-pay

## 2014-12-24 ENCOUNTER — Telehealth: Payer: Self-pay | Admitting: Obstetrics & Gynecology

## 2014-12-24 DIAGNOSIS — Z1231 Encounter for screening mammogram for malignant neoplasm of breast: Secondary | ICD-10-CM

## 2014-12-24 DIAGNOSIS — M8580 Other specified disorders of bone density and structure, unspecified site: Secondary | ICD-10-CM

## 2014-12-24 DIAGNOSIS — Z780 Asymptomatic menopausal state: Secondary | ICD-10-CM

## 2014-12-24 NOTE — Addendum Note (Signed)
Addended by: Graylon Good on: 12/24/2014 02:38 PM   Modules accepted: Orders

## 2014-12-24 NOTE — Telephone Encounter (Signed)
Spoke with patient. Patient states that her BMD was order to [facility]. Patient usually goes to the [place]. New order placed for patient for BMD to the Breast center. Patient is agreeable and will call to schedule an appointment at this time.  Routing to provider for final review. Patient agreeable to disposition. Patient aware provider will review message and nurse will return call with any additional instructions or change of disposition. Will close encounter.

## 2014-12-24 NOTE — Telephone Encounter (Signed)
Telephone call from [place] at [facility].  States medicare will not accept diagnosis code as entered for BMD.  States Postmenopausal Estrogen Deficiency is not approved code.  New order entered using Osteopenia which was used in original 08/28/14 order entered by Edman Circle, FNP.

## 2014-12-24 NOTE — Telephone Encounter (Signed)
Pt states she needs to scheduled bone dx test. Unable to use order as written due to location she would like to go to.

## 2014-12-25 ENCOUNTER — Telehealth: Payer: Self-pay | Admitting: Nurse Practitioner

## 2014-12-25 NOTE — Telephone Encounter (Signed)
Patient needs her BMD order changed from [facility] to [place].Marland Kitchen No need to call patient unless you have questions. Last seen 08/18/14.

## 2014-12-25 NOTE — Telephone Encounter (Signed)
Order was changed by Abelino Derrick, [place]. Patient did not request call back. Confirmed with Cherish at [facility] that order is correct and patient may scheduled at her convenience.   Routing to provider for final review. Patient agreeable to disposition.   Will close encounter.

## 2015-02-01 ENCOUNTER — Ambulatory Visit
Admission: RE | Admit: 2015-02-01 | Discharge: 2015-02-01 | Disposition: A | Discharge status: Home / Self Care | Payer: Medicare Other | Source: Ambulatory Visit | Attending: Nurse Practitioner | Admitting: Nurse Practitioner

## 2015-02-01 ENCOUNTER — Ambulatory Visit: Payer: Self-pay

## 2015-02-01 ENCOUNTER — Ambulatory Visit
Admission: RE | Admit: 2015-02-01 | Discharge: 2015-02-01 | Disposition: A | Discharge status: Home / Self Care | Payer: Medicare Other | Source: Ambulatory Visit

## 2015-02-01 DIAGNOSIS — M8580 Other specified disorders of bone density and structure, unspecified site: Secondary | ICD-10-CM

## 2015-02-01 DIAGNOSIS — Z1231 Encounter for screening mammogram for malignant neoplasm of breast: Secondary | ICD-10-CM

## 2015-02-06 ENCOUNTER — Telehealth: Payer: Self-pay | Admitting: *Deleted

## 2015-02-06 NOTE — Telephone Encounter (Signed)
-----   Message from Kem Boroughs, [place] sent at 02/05/2015  7:23 PM EDT ----- Please let pt. Know that BMD done on 02/01/15 show the T Score at spine and hip to be in the normal range.  Comparison to previous study 12/2010 no changes and is stable.  Ok to repeat in 3 years since fracture risk is not increased.  Good job on exercise, calcium and Vit D.

## 2015-02-06 NOTE — Telephone Encounter (Signed)
I have attempted to contact this patient by phone with the following results: left message to return call to [place] at [phone] answering machine (home per [facility]).  No personal information given.  [phone] (Home) *Preferred*

## 2015-02-25 NOTE — Telephone Encounter (Signed)
Pt notified in result note on 02/11/15.  Closing encounter.

## 2015-10-25 ENCOUNTER — Ambulatory Visit: Payer: Medicare Other | Admitting: Obstetrics & Gynecology

## 2015-12-02 ENCOUNTER — Ambulatory Visit (INDEPENDENT_AMBULATORY_CARE_PROVIDER_SITE_OTHER): Payer: Medicare Other | Admitting: Obstetrics & Gynecology

## 2015-12-02 ENCOUNTER — Encounter: Payer: Self-pay | Admitting: Obstetrics & Gynecology

## 2015-12-02 VITALS — BP 122/84 | HR 66 | Resp 14 | Ht 68.5 in | Wt 157.0 lb

## 2015-12-02 DIAGNOSIS — Z1211 Encounter for screening for malignant neoplasm of colon: Secondary | ICD-10-CM | POA: Diagnosis not present

## 2015-12-02 DIAGNOSIS — Z01419 Encounter for gynecological examination (general) (routine) without abnormal findings: Secondary | ICD-10-CM

## 2015-12-02 NOTE — Progress Notes (Signed)
70 y.o. G2P2 MarriedCaucasianF here for annual exam.  Doing well.  No vaginal bleeding.  Has been released by oncology after 10 years from treatment.  Has some questions about eye itching.    Has a question about skin coloration changes in axilla.  Going to [place] on mission trip.  She needs some vaccines.  Information about travel clinic given.  D/W pt Hep C testing.  She has given blood regularly so not needed.   PCP:  Dr. Dema Severin  Patient's last menstrual period was 08/17/1998.          Sexually active: Yes.    The current method of family planning is post menopausal status.    Exercising: No.  Walking, yardwork Smoker:  no  Health Maintenance: Pap:  08/28/14 Neg History of abnormal Pap:  yes MMG:  02/01/15 BIRADS1:neg Colonoscopy: 2005. Needs to schedule.  Referral to GI will be make BMD:   02/01/15 Normal  TDaP:  05/18/2011 Pneumonia vaccine: Completed x 2 Screening Labs: done with Dr. Dema Severin, Urine today: Not collected   reports that she has never smoked. She has never used smokeless tobacco. She reports that she drinks alcohol. She reports that she does not use illicit drugs.  Past Medical History  Diagnosis Date  . Lipoma   . Alopecia     frontal  . Abnormal Pap smear   . Breast cancer, right 04/11/2004    invasive lobular carcinoma treated with 1 chemo treatment and radiation    Past Surgical History  Procedure Laterality Date  . Gynecologic cryosurgery  1988    cervical dysplasia  . Breast lumpectomy  01/10/2004    lymph node, chemo x 1/radiation    Current Outpatient Prescriptions  Medication Sig Dispense Refill  . Calcium Carbonate-Vitamin D (CALCIUM-VITAMIN D) 500-200 MG-UNIT per tablet Take 1 tablet by mouth 2 (two) times daily with a meal.    . glucosamine-chondroitin 500-400 MG tablet Take 1 tablet by mouth daily.    Marland Kitchen MIRVASO 0.33 % GEL Apply topically as directed.  0  . Multiple Vitamin (MULTIVITAMIN) capsule Take 1 capsule by mouth daily.    Marland Kitchen  tretinoin (RETIN-A) 0.05 % cream May have been changed again to new medication    . triamcinolone cream (KENALOG) 0.1 % apply ON THE SKIN TWICE A DAY  0  . aspirin 81 MG tablet Take 81 mg by mouth daily. Reported on 12/02/2015    . Cholecalciferol (VITAMIN D3) 400 UNITS CAPS Take 1 capsule by mouth daily. Reported on 12/02/2015     No current facility-administered medications for this visit.    Family History  Problem Relation Age of Onset  . Breast cancer Sister 73    mental retardation  . Breast cancer Paternal Grandmother   . Cancer Maternal Grandmother     unknown type  . Breast cancer Other     aunt  . Cancer Other     aunt, unknown type-had hysterectomy  . Hypertension Father   . Heart failure Father   . Thyroid disease Daughter     cyst on thyroid-removed partial thyroid  . Thyroid disease Mother     ROS:  Pertinent items are noted in HPI.  Otherwise, a comprehensive ROS was negative.  Exam:   BP 122/84 mmHg  Pulse 66  Resp 14  Ht 5' 8.5" (1.74 m)  Wt 157 lb (71.215 kg)  BMI 23.52 kg/m2  LMP 08/17/1998  Weight change: -1#   Height: 5' 8.5" (174 cm)  Ht Readings  from Last 3 Encounters:  12/02/15 5' 8.5" (1.74 m)  08/28/14 5' 8.5" (1.74 m)  06/15/13 5' 8.75" (1.746 m)    General appearance: alert, cooperative and appears stated age Head: Normocephalic, without obvious abnormality, atraumatic Neck: no adenopathy, supple, symmetrical, trachea midline and thyroid normal to inspection and palpation Lungs: clear to auscultation bilaterally Breasts: Well healed right breast skin scar, axillary discoloraiton bilaterally, no masses, no nipple discharge, no skin changes Heart: regular rate and rhythm Abdomen: soft, non-tender; bowel sounds normal; no masses,  no organomegaly Extremities: extremities normal, atraumatic, no cyanosis or edema Skin: Skin color, texture, turgor normal. No rashes or lesions Lymph nodes: Cervical, supraclavicular, and axillary nodes normal. No  abnormal inguinal nodes palpated Neurologic: Grossly normal   Pelvic: External genitalia:  no lesions              Urethra:  normal appearing urethra with no masses, tenderness or lesions              Bartholins and Skenes: normal                 Vagina: normal appearing vagina with normal color and discharge, no lesions              Cervix: no lesions              Pap taken: No. Bimanual Exam:  Uterus:  normal size, contour, position, consistency, mobility, non-tender              Adnexa: normal adnexa and no mass, fullness, tenderness               Rectovaginal: Confirms               Anus:  normal sphincter tone, no lesions  Chaperone was present for exam.  A:  Well Woman with normal exam H/O breast cancer 4/09, released from oncology in 2015 Family hx of breast cancer in sister and PGM Eye itching.  Encouraged pt to contact ophthalmologist regarding this.  P: Mammogram yearly.  Pt is doing 3D MMG. Information regarding travel clinic at [place] given Pap 2016.  No Pap today. Lab work will be done with Dr. Dema Severin in June.  Needs Cipro and anti-malarial medications prescriptions.  Declines them today as states she will see Dr. Dema Severin in June before the trip return annually or prn

## 2015-12-03 ENCOUNTER — Encounter: Payer: Self-pay | Admitting: Internal Medicine

## 2015-12-30 ENCOUNTER — Other Ambulatory Visit: Payer: Self-pay

## 2015-12-30 DIAGNOSIS — Z1231 Encounter for screening mammogram for malignant neoplasm of breast: Secondary | ICD-10-CM

## 2016-01-02 ENCOUNTER — Encounter: Payer: Self-pay | Admitting: Internal Medicine

## 2016-01-02 ENCOUNTER — Ambulatory Visit (AMBULATORY_SURGERY_CENTER): Payer: Self-pay

## 2016-01-02 VITALS — Ht 68.5 in | Wt 156.4 lb

## 2016-01-02 DIAGNOSIS — Z1211 Encounter for screening for malignant neoplasm of colon: Secondary | ICD-10-CM

## 2016-01-02 NOTE — Progress Notes (Signed)
No allergies to eggs or soy No past problems with anesthesia No home oxygen No diet meds  Has email and internet; declined emmi 

## 2016-01-16 ENCOUNTER — Encounter: Payer: Self-pay | Admitting: Internal Medicine

## 2016-01-16 ENCOUNTER — Ambulatory Visit (AMBULATORY_SURGERY_CENTER): Payer: Medicare Other | Admitting: Internal Medicine

## 2016-01-16 VITALS — BP 162/73 | HR 58 | Temp 98.2°F | Resp 12 | Ht 68.0 in | Wt 156.0 lb

## 2016-01-16 DIAGNOSIS — Z1211 Encounter for screening for malignant neoplasm of colon: Secondary | ICD-10-CM

## 2016-01-16 MED ORDER — SODIUM CHLORIDE 0.9 % IV SOLN: 500.0000 mL | INTRAVENOUS | Status: DC

## 2016-01-16 NOTE — Progress Notes (Signed)
To pacu vss patent aw report to rn 

## 2016-01-16 NOTE — Op Note (Signed)
[facility] Patient Name: Cathy Montoya Procedure Date: 01/16/2016 8:19 AM MRN: [id number] Endoscopist: Gatha Mayer , MD Age: 70 Referring MD:  Date of Birth: 1945/12/29 Gender: Female Procedure:                Colonoscopy Indications:              Screening for colorectal malignant neoplasm Medicines:                Propofol per Anesthesia, Monitored Anesthesia Care Procedure:                Pre-Anesthesia Assessment:                           - Prior to the procedure, a History and Physical                            was performed, and patient medications and                            allergies were reviewed. The patient's tolerance of                            previous anesthesia was also reviewed. The risks                            and benefits of the procedure and the sedation                            options and risks were discussed with the patient.                            All questions were answered, and informed consent                            was obtained. Prior Anticoagulants: The patient has                            taken no previous anticoagulant or antiplatelet                            agents. ASA Grade Assessment: II - A patient with                            mild systemic disease. After reviewing the risks                            and benefits, the patient was deemed in                            satisfactory condition to undergo the procedure.                           After obtaining informed consent, the colonoscope  was passed under direct vision. Throughout the                            procedure, the patient's blood pressure, pulse, and                            oxygen saturations were monitored continuously. The                            Model CF-HQ190L [id number]) scope was introduced                            through the anus and advanced to the the cecum,                            identified by  appendiceal orifice and ileocecal                            valve. The quality of the bowel preparation was                            excellent. The colonoscopy was performed without                            difficulty. The patient tolerated the procedure                            well. The bowel preparation used was Miralax. The                            ileocecal valve, appendiceal orifice, and rectum                            were photographed. Scope In: 8:36:37 AM Scope Out: 8:48:18 AM Scope Withdrawal Time: 0 hours 8 minutes 50 seconds  Total Procedure Duration: 0 hours 11 minutes 41 seconds  Findings:                 The colon (entire examined portion) appeared normal.                           No additional abnormalities were found on                            retroflexion. Complications:            No immediate complications. Estimated blood loss:                            None. Estimated Blood Loss:     Estimated blood loss: none. Recommendation:           - Patient has a contact number available for                            emergencies. The signs and symptoms of potential  delayed complications were discussed with the                            patient. Return to normal activities tomorrow.                            Written discharge instructions were provided to the                            patient.                           - Resume previous diet.                           - Continue present medications.                           - No repeat colonoscopy due to age. Gatha Mayer, MD 01/16/2016 8:54:01 AM This report has been signed electronically. CC Letter to:             [employer]

## 2016-01-16 NOTE — Patient Instructions (Addendum)
The colonoscopy was normal - no polys or cancer seen!  These procedures typically end around 75-80 so I am not recommending a routine repeat exam but would do one if signs or symptoms warranted.  I appreciate the opportunity to care for you. Gatha Mayer, MD, FACG   YOU HAD AN ENDOSCOPIC PROCEDURE TODAY AT [facility]:   Refer to the procedure report that was given to you for any specific questions about what was found during the examination.  If the procedure report does not answer your questions, please call your gastroenterologist to clarify.  If you requested that your care partner not be given the details of your procedure findings, then the procedure report has been included in a sealed envelope for you to review at your convenience later.  YOU SHOULD EXPECT: Some feelings of bloating in the abdomen. Passage of more gas than usual.  Walking can help get rid of the air that was put into your GI tract during the procedure and reduce the bloating. If you had a lower endoscopy (such as a colonoscopy or flexible sigmoidoscopy) you may notice spotting of blood in your stool or on the toilet paper. If you underwent a bowel prep for your procedure, you may not have a normal bowel movement for a few days.  Please Note:  You might notice some irritation and congestion in your nose or some drainage.  This is from the oxygen used during your procedure.  There is no need for concern and it should clear up in a day or so.  SYMPTOMS TO REPORT IMMEDIATELY:     Following upper endoscopy (EGD)  Vomiting of blood or coffee ground material  New chest pain or pain under the shoulder blades  Painful or persistently difficult swallowing  New shortness of breath  Fever of 100F or higher  Black, tarry-looking stools  For urgent or emergent issues, a gastroenterologist can be reached at any hour by calling [phone].   DIET: Your first meal following the procedure should  be a small meal and then it is ok to progress to your normal diet. Heavy or fried foods are harder to digest and may make you feel nauseous or bloated.  Likewise, meals heavy in dairy and vegetables can increase bloating.  Drink plenty of fluids but you should avoid alcoholic beverages for 24 hours.  ACTIVITY:  You should plan to take it easy for the rest of today and you should NOT DRIVE or use heavy machinery until tomorrow (because of the sedation medicines used during the test).    FOLLOW UP: Our staff will call the number listed on your records the next business day following your procedure to check on you and address any questions or concerns that you may have regarding the information given to you following your procedure. If we do not reach you, we will leave a message.  However, if you are feeling well and you are not experiencing any problems, there is no need to return our call.  We will assume that you have returned to your regular daily activities without incident.  If any biopsies were taken you will be contacted by phone or by letter within the next 1-3 weeks.  Please call us at [phone] if you have not heard about the biopsies in 3 weeks.    SIGNATURES/CONFIDENTIALITY: You and/or your care partner have signed paperwork which will be entered into your electronic medical record.  These signatures attest to the  fact that that the information above on your After Visit Summary has been reviewed and is understood.  Full responsibility of the confidentiality of this discharge information lies with you and/or your care-partner.   

## 2016-01-17 ENCOUNTER — Telehealth: Payer: Self-pay

## 2016-01-17 NOTE — Telephone Encounter (Signed)
  Follow up Call-  Call back number 01/16/2016  Post procedure Call Back phone  # [phone]  Permission to leave phone message Yes     Patient questions:  Do you have a fever, pain , or abdominal swelling? No. Pain Score  0 *  Have you tolerated food without any problems? Yes.    Have you been able to return to your normal activities? Yes.    Do you have any questions about your discharge instructions: Diet   No. Medications  No. Follow up visit  No.  Do you have questions or concerns about your Care? No.  Actions: * If pain score is 4 or above: No action needed, pain <4.

## 2016-02-11 ENCOUNTER — Ambulatory Visit
Admission: RE | Admit: 2016-02-11 | Discharge: 2016-02-11 | Disposition: A | Discharge status: Home / Self Care | Payer: Medicare Other | Source: Ambulatory Visit

## 2016-02-11 DIAGNOSIS — Z1231 Encounter for screening mammogram for malignant neoplasm of breast: Secondary | ICD-10-CM

## 2016-12-31 ENCOUNTER — Telehealth: Payer: Self-pay | Admitting: *Deleted

## 2016-12-31 DIAGNOSIS — M8580 Other specified disorders of bone density and structure, unspecified site: Secondary | ICD-10-CM

## 2016-12-31 NOTE — Telephone Encounter (Signed)
Zigmund Daniel from [employer] [facility] [employer] calling for order for BMD, patient is due to schedule. Last BMD 02/01/15. Donzetta Sprung order placed in EPIC.  Routing to provider for final review. Patient is agreeable to disposition. Will close encounter.  Cc: Kem Boroughs, NP

## 2017-01-01 ENCOUNTER — Ambulatory Visit (INDEPENDENT_AMBULATORY_CARE_PROVIDER_SITE_OTHER): Payer: Medicare Other | Admitting: Obstetrics & Gynecology

## 2017-01-01 ENCOUNTER — Encounter: Payer: Self-pay | Admitting: Obstetrics & Gynecology

## 2017-01-01 ENCOUNTER — Other Ambulatory Visit (HOSPITAL_COMMUNITY)
Admission: RE | Admit: 2017-01-01 | Discharge: 2017-01-01 | Disposition: A | Discharge status: Home / Self Care | Payer: Medicare Other | Source: Ambulatory Visit | Attending: Obstetrics & Gynecology | Admitting: Obstetrics & Gynecology

## 2017-01-01 VITALS — BP 120/64 | HR 64 | Resp 12 | Ht 68.5 in | Wt 161.0 lb

## 2017-01-01 DIAGNOSIS — Z1231 Encounter for screening mammogram for malignant neoplasm of breast: Secondary | ICD-10-CM

## 2017-01-01 DIAGNOSIS — Z01419 Encounter for gynecological examination (general) (routine) without abnormal findings: Secondary | ICD-10-CM

## 2017-01-01 DIAGNOSIS — Z124 Encounter for screening for malignant neoplasm of cervix: Secondary | ICD-10-CM | POA: Diagnosis not present

## 2017-01-01 DIAGNOSIS — Z853 Personal history of malignant neoplasm of breast: Secondary | ICD-10-CM

## 2017-01-01 DIAGNOSIS — Z1239 Encounter for other screening for malignant neoplasm of breast: Secondary | ICD-10-CM

## 2017-01-01 NOTE — Progress Notes (Signed)
71 y.o. G2P2 Married Caucasian F here for annual exam.  Doing well.  Youngest daughter just graduated from [employer] with [employer].    Reports episode of heart "fluttering" for a few minutes last night.  Stopped on own.  No SOB or chest pains.  Wants to know if should take ASA.  Denies vaginal bleeding.    PCP:  Dr. Dema Severin.  She had last appt in June, 2017.    Patient's last menstrual period was 08/17/1998.          Sexually active: Yes.    The current method of family planning is post menopausal status.    Exercising: No.  The patient does not participate in regular exercise at present. Smoker:  no  Health Maintenance: Pap:  08/28/14 Neg; 05/09/12 Neg  History of abnormal Pap:  Yes, years ago MMG:  02/11/16 BIRADS1, Density B, [facility] Colonoscopy:  01/16/16 Normal. No repeat due to age.  BMD:   02/01/15 Normal  TDaP: 05/18/2011 Pneumonia vaccine(s):  Completed x2 Zostavax:  Completed Hep C testing: pt is pretty sure she's had this done Labs: PCP   reports that she has never smoked. She has never used smokeless tobacco. She reports that she drinks alcohol. She reports that she does not use drugs.  Past Medical History:  Diagnosis Date  . Abnormal Pap smear   . Alopecia    frontal  . Arthritis    knees, lt  hip, lower back, hands, shoulders  . Breast cancer, right ([place]) 04/11/2004   invasive lobular carcinoma treated with 1 chemo treatment and radiation  . Cat allergies   . Heart murmur    diagnosed during pregnancy  . Lipoma     Past Surgical History:  Procedure Laterality Date  . BREAST LUMPECTOMY  01/10/2004   lymph node, chemo x 1/radiation  . COLONOSCOPY    . GYNECOLOGIC CRYOSURGERY  1988   cervical dysplasia    Current Outpatient Prescriptions  Medication Sig Dispense Refill  . Cholecalciferol (VITAMIN D3) 400 UNITS CAPS Take 1 capsule by mouth daily. Reported on 12/02/2015    . glucosamine-chondroitin 500-400 MG tablet Take 1 tablet by mouth daily.    . Multiple  Vitamin (MULTIVITAMIN) capsule Take 1 capsule by mouth daily.     No current facility-administered medications for this visit.     Family History  Problem Relation Age of Onset  . Breast cancer Sister 42       mental retardation  . Breast cancer Paternal Grandmother   . Cancer Maternal Grandmother        unknown type  . Breast cancer Other        aunt  . Cancer Other        aunt, unknown type-had hysterectomy  . Hypertension Father   . Heart failure Father   . Thyroid disease Daughter        cyst on thyroid-removed partial thyroid  . Thyroid disease Mother   . Colon cancer Neg Hx     ROS:  Pertinent items are noted in HPI.  Otherwise, a comprehensive ROS was negative.  Exam:   BP 120/64 (BP Location: Right Arm, Patient Position: Sitting, Cuff Size: Normal)   Pulse 64   Resp 12   Ht 5' 8.5" (1.74 m)   Wt 161 lb (73 kg)   LMP 08/17/1998   BMI 24.12 kg/m   Weight change:+4#  Height: 5' 8.5" (174 cm)  Ht Readings from Last 3 Encounters:  01/01/17 5' 8.5" (1.74  m)  01/16/16 5\' 8"  (1.727 m)  01/02/16 5' 8.5" (1.74 m)   General appearance: alert, cooperative and appears stated age Head: Normocephalic, without obvious abnormality, atraumatic Neck: no adenopathy, supple, symmetrical, trachea midline and thyroid normal to inspection and palpation Lungs: clear to auscultation bilaterally Breasts: right breast with well healed right breast scar with retraction and radiation changes, no masses, no LAD.  Left breast without masses, skin changes, LAD, nipple discharge    Heart: regular rate and rhythm Abdomen: soft, non-tender; bowel sounds normal; no masses,  no organomegaly Extremities: extremities normal, atraumatic, no cyanosis or edema Skin: Skin color, texture, turgor normal. No rashes or lesions Lymph nodes: Cervical, supraclavicular, and axillary nodes normal. No abnormal inguinal nodes palpated Neurologic: Grossly normal  Pelvic: External genitalia:  no lesions               Urethra:  normal appearing urethra with no masses, tenderness or lesions              Bartholins and Skenes: normal                 Vagina: normal appearing vagina with normal color and discharge, no lesions              Cervix: no lesions              Pap taken: Yes.   Bimanual Exam:  Uterus:  normal size, contour, position, consistency, mobility, non-tender              Adnexa: normal adnexa and no mass, fullness, tenderness               Rectovaginal: Confirms               Anus:  normal sphincter tone, no lesions  Chaperone was present for exam.  A:  Well Woman with normal exam PMP, no HRT H/O breast cancer 4/09, released from oncology in 2015 Family hx of breast cancer in sister and PGM Cardiac arrhythmia, likely PVCs  P:   Mammogram guidelines reviewed.  Doing 3D MMG. pap smear obtained today Lab work is UTD.  Has appt with Dr. Dema Severin in June.   Rx for shingrix vaccine given today She is going to check with Dr. Dema Severin about whether she's had the Hepatitis C testing done Cardiology consultation recommended.  Pt declines and desires to see Dr. Dema Severin first.  States she "goes to church with her" so will talk with her about what may be needed.   return annually or prn

## 2017-01-01 NOTE — Patient Instructions (Signed)
Double check with Dr. Dema Severin about whether you've had this done.

## 2017-01-04 LAB — CYTOLOGY - PAP: Diagnosis: NEGATIVE

## 2017-02-11 ENCOUNTER — Ambulatory Visit
Admission: RE | Admit: 2017-02-11 | Discharge: 2017-02-11 | Disposition: A | Discharge status: Home / Self Care | Payer: Medicare Other | Source: Ambulatory Visit | Attending: Obstetrics & Gynecology | Admitting: Obstetrics & Gynecology

## 2017-02-11 DIAGNOSIS — Z853 Personal history of malignant neoplasm of breast: Secondary | ICD-10-CM

## 2017-02-11 DIAGNOSIS — Z1239 Encounter for other screening for malignant neoplasm of breast: Secondary | ICD-10-CM

## 2017-03-12 ENCOUNTER — Ambulatory Visit: Payer: Medicare Other | Admitting: Obstetrics & Gynecology

## 2017-04-08 ENCOUNTER — Ambulatory Visit
Admission: RE | Admit: 2017-04-08 | Discharge: 2017-04-08 | Disposition: A | Discharge status: Home / Self Care | Payer: Medicare Other | Source: Ambulatory Visit | Attending: Obstetrics & Gynecology | Admitting: Obstetrics & Gynecology

## 2017-04-08 DIAGNOSIS — M8580 Other specified disorders of bone density and structure, unspecified site: Secondary | ICD-10-CM

## 2017-10-22 ENCOUNTER — Telehealth: Payer: Self-pay | Admitting: *Deleted

## 2017-10-22 NOTE — Telephone Encounter (Signed)
REFERRAL SENT TO SCHEDULING FROM DR. CYNTHIA WHITE [phone]

## 2017-11-12 ENCOUNTER — Other Ambulatory Visit: Payer: Self-pay | Admitting: Family Medicine

## 2017-11-12 ENCOUNTER — Other Ambulatory Visit: Payer: Self-pay | Admitting: *Deleted

## 2017-11-12 DIAGNOSIS — R002 Palpitations: Secondary | ICD-10-CM

## 2017-11-12 NOTE — Telephone Encounter (Signed)
Referral sent to scheduling from Harlan Stains, MD of [facility] P# [phone]  F# [phone]

## 2017-11-25 ENCOUNTER — Ambulatory Visit (INDEPENDENT_AMBULATORY_CARE_PROVIDER_SITE_OTHER): Payer: Medicare Other

## 2017-11-25 DIAGNOSIS — R002 Palpitations: Secondary | ICD-10-CM | POA: Diagnosis not present

## 2017-12-10 DIAGNOSIS — M25552 Pain in left hip: Secondary | ICD-10-CM | POA: Insufficient documentation

## 2017-12-29 ENCOUNTER — Ambulatory Visit: Payer: Medicare Other | Admitting: Cardiovascular Disease

## 2018-01-25 ENCOUNTER — Encounter: Payer: Self-pay | Admitting: Cardiovascular Disease

## 2018-01-25 ENCOUNTER — Encounter (INDEPENDENT_AMBULATORY_CARE_PROVIDER_SITE_OTHER): Payer: Self-pay

## 2018-01-25 ENCOUNTER — Ambulatory Visit: Payer: Medicare Other | Admitting: Cardiovascular Disease

## 2018-01-25 VITALS — BP 150/70 | HR 66 | Ht 68.5 in | Wt 159.0 lb

## 2018-01-25 DIAGNOSIS — R002 Palpitations: Secondary | ICD-10-CM | POA: Diagnosis not present

## 2018-01-25 DIAGNOSIS — R0602 Shortness of breath: Secondary | ICD-10-CM

## 2018-01-25 NOTE — Patient Instructions (Signed)
Medication Instructions:  Your physician recommends that you continue on your current medications as directed. Please refer to the Current Medication list given to you today.  Labwork: None  Testing/Procedures: Your physician has requested that you have an echocardiogram. Echocardiography is a painless test that uses sound waves to create images of your heart. It provides your doctor with information about the size and shape of your heart and how well your heart's chambers and valves are working. This procedure takes approximately one hour. There are no restrictions for this procedure.   Follow-Up: Your physician recommends that you schedule a follow-up appointment in: 3-4 months with Dr. Angelena Form.    Any Other Special Instructions Will Be Listed Below (If Applicable).     If you need a refill on your cardiac medications before your next appointment, please call your pharmacy.

## 2018-01-25 NOTE — Progress Notes (Signed)
Chief Complaint  Patient presents with  . New Patient (Initial Visit)    palpitations   History of Present Illness: 72 yo female with history of arthritis, breast cancer, HLD here today as a new consult, referred by Dr. Dema Severin, for the evaluation of palpitations. She reported this in primary care in March 2019. Cardiac monitor April 2019 arranged by Dr. Dema Severin showed sinus bradycardia with nocturnal bradycardia, rates as low as 42 beats per minute, but no heart block or atrial fibrillation noted. She tells me today that she had every episode in the middle of the night. These episodes lasted for several hours. She also has exertional dyspnea. No chest pain. No LE edema. She has been very active in the past but is now noticing more fatigue after exertion. She has never smoked. No known cardiac disease.   Primary Care Physician: Harlan Stains, MD   Past Medical History:  Diagnosis Date  . Abnormal Pap smear   . Acne rosacea   . Acne-like skin bumps   . Acute left-sided low back pain without sciatica   . Alopecia    frontal  . Arthritis    knees, lt  hip, lower back, hands, shoulders  . Breast cancer, right ([place]) 04/11/2004   invasive lobular carcinoma treated with 1 chemo treatment and radiation  . Cat allergies   . Chest tightness   . Heart murmur    diagnosed during pregnancy  . Impacted cerumen of left ear   . Intermittent vertigo    FRONTAL FIBROSIS ALOPECIA  . Left hip pain   . Lipoma   . Palpitations   . Shoulder pain, left     Past Surgical History:  Procedure Laterality Date  . BREAST LUMPECTOMY  01/10/2004   lymph node, chemo x 1/radiation  . COLONOSCOPY    . GYNECOLOGIC CRYOSURGERY  1988   cervical dysplasia    Current Outpatient Medications  Medication Sig Dispense Refill  . aspirin EC 81 MG tablet Take 81 mg by mouth daily.    Marland Kitchen glucosamine-chondroitin 500-400 MG tablet Take 1 tablet by mouth daily.    . metroNIDAZOLE (METROCREAM) 0.75 % cream Apply 3.50  application topically as needed for skin breakdown.  11  . Multiple Vitamin (MULTIVITAMIN) capsule Take 1 capsule by mouth daily.    . timolol (TIMOPTIC) 0.5 % ophthalmic solution Place 1 drop into both eyes 2 (two) times daily.  24   No current facility-administered medications for this visit.     No Known Allergies  Social History   Socioeconomic History  . Marital status: Married    Spouse name: Not on file  . Number of children: 2  . Years of education: Not on file  . Highest education level: Not on file  Occupational History  . Occupation: Firefighter  Social Needs  . Financial resource strain: Not on file  . Food insecurity:    Worry: Not on file    Inability: Not on file  . Transportation needs:    Medical: Not on file    Non-medical: Not on file  Tobacco Use  . Smoking status: Never Smoker  . Smokeless tobacco: Never Used  Substance and Sexual Activity  . Alcohol use: Yes    Comment: wine-occ  . Drug use: No  . Sexual activity: Yes    Partners: Male    Birth control/protection: Post-menopausal  Lifestyle  . Physical activity:    Days per week: Not on file    Minutes per session: Not  on file  . Stress: Not on file  Relationships  . Social connections:    Talks on phone: Not on file    Gets together: Not on file    Attends religious service: Not on file    Active member of club or organization: Not on file    Attends meetings of clubs or organizations: Not on file    Relationship status: Not on file  . Intimate partner violence:    Fear of current or ex partner: Not on file    Emotionally abused: Not on file    Physically abused: Not on file    Forced sexual activity: Not on file  Other Topics Concern  . Not on file  Social History Narrative  . Not on file    Family History  Problem Relation Age of Onset  . Hypertension Father   . Cancer Father        PROSTATE  . Thyroid disease Mother   . Alzheimer's disease Mother   . Breast cancer  Sister 14       mental retardation  . Breast cancer Paternal Grandmother   . Cancer Maternal Grandmother        unknown type  . Breast cancer Maternal Grandmother   . Breast cancer Other        aunt  . Cancer Other        aunt, unknown type-had hysterectomy  . Thyroid disease Daughter        cyst on thyroid-removed partial thyroid  . Other Maternal Grandfather        TRAIN WRECK  . Colon cancer Neg Hx     Review of Systems:  As stated in the HPI and otherwise negative.   BP (!) 150/70   Pulse 66   Ht 5' 8.5" (1.74 m)   Wt 159 lb (72.1 kg)   LMP 08/17/1998   SpO2 97%   BMI 23.82 kg/m   Physical Examination: General: Well developed, well nourished, NAD  HEENT: OP clear, mucus membranes moist  SKIN: warm, dry. No rashes. Neuro: No focal deficits  Musculoskeletal: Muscle strength 5/5 all ext  Psychiatric: Mood and affect normal  Neck: No JVD, no carotid bruits, no thyromegaly, no lymphadenopathy.  Lungs:Clear bilaterally, no wheezes, rhonci, crackles Cardiovascular: Regular rate and rhythm. No murmurs, gallops or rubs. Abdomen:Soft. Bowel sounds present. Non-tender.  Extremities: No lower extremity edema. Pulses are 2 + in the bilateral DP/PT.  EKG:  EKG is ordered today. The ekg ordered today demonstrates NSR, rate 66 bpm. T wave flattening lateral and anterolateral leads.   Recent Labs: No results found for requested labs within last 8760 hours.   Lipid Panel No results found for: CHOL, TRIG, HDL, CHOLHDL, VLDL, LDLCALC, LDLDIRECT   Wt Readings from Last 3 Encounters:  01/25/18 159 lb (72.1 kg)  01/01/17 161 lb (73 kg)  01/16/16 156 lb (70.8 kg)     Other studies Reviewed: Additional studies/ records that were reviewed today include: . Review of the above records demonstrates:    Assessment and Plan:   1. Palpitations/Dyspnea: Her cardiac monitor for 30 days did not show an arrythmia. She reports no symptoms while wearing it. I suspect that her episodes of  palpitations was premature beats. I will arrange an echo to assess LV size and function, exclude structural heart disease. I have reviewed the [employer] KardioMobil device for her iphone that she can purchase to record a strip if she is having palpitations. I will see her  back after he echo.   Current medicines are reviewed at length with the patient today.  The patient does not have concerns regarding medicines.  The following changes have been made:  no change  Labs/ tests ordered today include:   Orders Placed This Encounter  Procedures  . EKG 12-Lead  . ECHOCARDIOGRAM COMPLETE     Disposition:   FU with me in 4 months   Signed, Lauree Chandler, MD 01/25/2018 9:35 AM    [facility] [address], [place], [place]  [address] Phone: [phone]; Fax: [phone]

## 2018-02-04 ENCOUNTER — Other Ambulatory Visit: Payer: Self-pay

## 2018-02-04 ENCOUNTER — Emergency Department (HOSPITAL_COMMUNITY)
Admission: EM | Admit: 2018-02-04 | Discharge: 2018-02-04 | Disposition: A | Discharge status: Home / Self Care | Payer: Medicare Other | Attending: Emergency Medicine | Admitting: Emergency Medicine

## 2018-02-04 ENCOUNTER — Emergency Department (HOSPITAL_COMMUNITY): Payer: Medicare Other

## 2018-02-04 ENCOUNTER — Ambulatory Visit (HOSPITAL_COMMUNITY): Payer: Medicare Other

## 2018-02-04 ENCOUNTER — Encounter (HOSPITAL_COMMUNITY): Payer: Self-pay

## 2018-02-04 ENCOUNTER — Encounter (HOSPITAL_COMMUNITY): Payer: Self-pay | Admitting: Emergency Medicine

## 2018-02-04 ENCOUNTER — Other Ambulatory Visit (INDEPENDENT_AMBULATORY_CARE_PROVIDER_SITE_OTHER): Payer: Medicare Other

## 2018-02-04 ENCOUNTER — Telehealth: Payer: Self-pay

## 2018-02-04 ENCOUNTER — Other Ambulatory Visit (HOSPITAL_COMMUNITY): Payer: Medicare Other

## 2018-02-04 DIAGNOSIS — I4891 Unspecified atrial fibrillation: Secondary | ICD-10-CM

## 2018-02-04 DIAGNOSIS — I483 Typical atrial flutter: Secondary | ICD-10-CM | POA: Diagnosis not present

## 2018-02-04 DIAGNOSIS — I480 Paroxysmal atrial fibrillation: Secondary | ICD-10-CM | POA: Diagnosis not present

## 2018-02-04 DIAGNOSIS — Z853 Personal history of malignant neoplasm of breast: Secondary | ICD-10-CM | POA: Insufficient documentation

## 2018-02-04 DIAGNOSIS — Z7982 Long term (current) use of aspirin: Secondary | ICD-10-CM | POA: Diagnosis not present

## 2018-02-04 DIAGNOSIS — R002 Palpitations: Secondary | ICD-10-CM | POA: Diagnosis not present

## 2018-02-04 HISTORY — DX: Typical atrial flutter: I48.3

## 2018-02-04 LAB — CBC
HEMATOCRIT: 48.7 % — AB (ref 36.0–46.0)
HEMOGLOBIN: 15.1 g/dL — AB (ref 12.0–15.0)
MCH: 29.4 pg (ref 26.0–34.0)
MCHC: 31 g/dL (ref 30.0–36.0)
MCV: 94.9 fL (ref 78.0–100.0)
Platelets: 228 10*3/uL (ref 150–400)
RBC: 5.13 MIL/uL — ABNORMAL HIGH (ref 3.87–5.11)
RDW: 13.6 % (ref 11.5–15.5)
WBC: 9.2 10*3/uL (ref 4.0–10.5)

## 2018-02-04 LAB — BASIC METABOLIC PANEL
ANION GAP: 9 (ref 5–15)
BUN: 23 mg/dL — AB (ref 6–20)
CHLORIDE: 105 mmol/L (ref 101–111)
CO2: 28 mmol/L (ref 22–32)
Calcium: 10 mg/dL (ref 8.9–10.3)
Creatinine, Ser: 0.99 mg/dL (ref 0.44–1.00)
GFR calc Af Amer: >60 mL/min (ref >60)
GFR, EST NON AFRICAN AMERICAN: 56 mL/min — AB (ref >60)
Glucose, Bld: 95 mg/dL (ref 65–99)
POTASSIUM: 4.4 mmol/L (ref 3.5–5.1)
Sodium: 142 mmol/L (ref 135–145)

## 2018-02-04 LAB — I-STAT TROPONIN, ED: Troponin i, poc: 0.01 ng/mL (ref 0.00–0.08)

## 2018-02-04 MED ORDER — HEPARIN (PORCINE) IN NACL 100-0.45 UNIT/ML-% IJ SOLN: 1000.0000 [IU]/h | INTRAMUSCULAR | Status: DC

## 2018-02-04 MED ORDER — HEPARIN (PORCINE) IN NACL 100-0.45 UNIT/ML-% IJ SOLN
1050.0000 [IU]/h | INTRAMUSCULAR | Status: DC
Start: 2018-02-04 — End: 2018-02-04
  Filled 2018-02-04: qty 250

## 2018-02-04 MED ORDER — FLECAINIDE ACETATE 100 MG PO TABS
300.0000 mg | ORAL_TABLET | Freq: Once | ORAL | Status: AC
  Administered 2018-02-04: 300 mg via ORAL
  Filled 2018-02-04: qty 3

## 2018-02-04 MED ORDER — DILTIAZEM HCL 30 MG PO TABS: 30.0000 mg | ORAL_TABLET | Freq: Once | ORAL | 0 refills | Status: DC

## 2018-02-04 MED ORDER — RIVAROXABAN 20 MG PO TABS
20.0000 mg | ORAL_TABLET | Freq: Every day | ORAL | Status: DC
  Administered 2018-02-04: 20 mg via ORAL
  Filled 2018-02-04: qty 1

## 2018-02-04 MED ORDER — RIVAROXABAN 20 MG PO TABS: 20.0000 mg | ORAL_TABLET | Freq: Every day | ORAL | 0 refills | Status: DC

## 2018-02-04 MED ORDER — DILTIAZEM HCL 30 MG PO TABS
30.0000 mg | ORAL_TABLET | Freq: Once | ORAL | Status: AC
Start: 2018-02-04 — End: 2018-02-04
  Administered 2018-02-04: 30 mg via ORAL
  Filled 2018-02-04: qty 1

## 2018-02-04 MED ORDER — FLECAINIDE ACETATE 100 MG PO TABS: 300.0000 mg | ORAL_TABLET | Freq: Once | ORAL | 0 refills | Status: DC

## 2018-02-04 MED ORDER — DEXTROSE 5 % IV SOLN
5.0000 mg/h | INTRAVENOUS | Status: DC
  Filled 2018-02-04: qty 100

## 2018-02-04 MED ORDER — HEPARIN BOLUS VIA INFUSION: 4000.0000 [IU] | Freq: Once | INTRAVENOUS | Status: DC

## 2018-02-04 MED ORDER — HEPARIN BOLUS VIA INFUSION
4000.0000 [IU] | Freq: Once | INTRAVENOUS | Status: DC
  Filled 2018-02-04: qty 4000

## 2018-02-04 MED ORDER — DILTIAZEM LOAD VIA INFUSION
10.0000 mg | Freq: Once | INTRAVENOUS | Status: DC
  Filled 2018-02-04: qty 10

## 2018-02-04 NOTE — ED Triage Notes (Signed)
Pt states around 0100am she began having a racing feeling to her heart, denies any pain. Sent by PCP for HR in the 140s. 158 at triage. Pt states this happened in October as well but her heart converted its-self then.

## 2018-02-04 NOTE — Discharge Instructions (Signed)

## 2018-02-04 NOTE — Telephone Encounter (Signed)
Patient was referred to our office by PCP for palpitations. Patient had wore a monitor in the past that only showed sinus bradycardia with nocturnal bradycardia, rates as low as 42 bpm, but no heart block or atrial fibrillation.   Patient presented to the office today for an echocardiogram for palpitations ordered by Dr. Angelena Form on 01/25/18. Call received into triage from echo department stating that the patient is tachycardiac and they reviewed with Dr. Burt Knack who is requesting an EKG. EKG performed and reviewed with Dr. Burt Knack. EKG showing Atrial flutter RVR rate of 141 bpm. Patient states that she can feel her heart beating fast. Patient denies SOB, lightheadedness, dizziness, chest pain, or any other symptoms. Per Dr. Burt Knack, patient to go to the ER for further work-up. Echo cancelled. Patient refused EMS transport and states that she will drive herself. Patient signed AMA form. Called [facility] and made aware that patient is on her way to the ER.

## 2018-02-04 NOTE — ED Provider Notes (Addendum)
[facility]ENC[facility] DEPART[facility] Provider Note   CSN: [id number] Arrival date & time: 02/04/18  9826     History   Chief Complaint Chief Complaint  Patient presents with  . Palpitations  . Rapid heart rate    HPI Cathy Montoya is a 72 y.o. female.  HPI  72 year old female presents today with rapid heart rate.  She has had multiple episodes of palpitations over the past several months.  She had a monitor placed by her primary care doctor in April that showed only bradycardia.  She was scheduled to see cardiology this month and have an echocardiogram today.  She presented for echocardiogram and was noted to be and what was thought to be atrial flutter with 2-1 block.  Subsequently, she was sent to the ED for evaluation.  She states that sometimes she gets some nausea but denies other symptoms with this.  She states is been happening intermittently and last sometimes for several hours.  She thinks she was last in a normal rhythm sometime during the night.  Feels palpitations and fluttering.  She denies chest pain, syncope, lightheadedness, or stroke type symptoms.  Has any cardiac history in the past. Review of chart from cardiology office on June 11 showed review of 30-day monitor that did not show apnea.  And scheduling for echocardiogram.  Past Medical History:  Diagnosis Date  . Abnormal Pap smear   . Acne rosacea   . Acne-like skin bumps   . Acute left-sided low back pain without sciatica   . Alopecia    frontal  . Arthritis    knees, lt  hip, lower back, hands, shoulders  . Breast cancer, right ([place]) 04/11/2004   invasive lobular carcinoma treated with 1 chemo treatment and radiation  . Cat allergies   . Chest tightness   . Heart murmur    diagnosed during pregnancy  . Impacted cerumen of left ear   . Intermittent vertigo    FRONTAL FIBROSIS ALOPECIA  . Left hip pain   . Lipoma   . Palpitations   . Shoulder pain, left     Patient Active Problem  List   Diagnosis Date Noted  . Left hip pain   . Breast cancer, right ([place]) 07/27/2012    Past Surgical History:  Procedure Laterality Date  . BREAST LUMPECTOMY  01/10/2004   lymph node, chemo x 1/radiation  . COLONOSCOPY    . GYNECOLOGIC CRYOSURGERY  1988   cervical dysplasia     OB History    Gravida  2   Para  2   Term      Preterm      AB      Living  2     SAB      TAB      Ectopic      Multiple      Live Births               Home Medications    Prior to Admission medications   Medication Sig Start Date End Date Taking? Authorizing Provider  aspirin EC 81 MG tablet Take 81 mg by mouth daily.    [provider]  glucosamine-chondroitin 500-400 MG tablet Take 1 tablet by mouth daily.    [provider]  metroNIDAZOLE (METROCREAM) 0.75 % cream Apply 4.15 application topically as needed for skin breakdown. 12/30/17   [provider]  Multiple Vitamin (MULTIVITAMIN) capsule Take 1 capsule by mouth daily.  [provider]  timolol (TIMOPTIC) 0.5 % ophthalmic solution Place 1 drop into both eyes 2 (two) times daily. 12/21/17   [provider]    Family History Family History  Problem Relation Age of Onset  . Hypertension Father   . Cancer Father        PROSTATE  . Thyroid disease Mother   . Alzheimer's disease Mother   . Breast cancer Sister 35       mental retardation  . Breast cancer Paternal Grandmother   . Cancer Maternal Grandmother        unknown type  . Breast cancer Maternal Grandmother   . Breast cancer Other        aunt  . Cancer Other        aunt, unknown type-had hysterectomy  . Thyroid disease Daughter        cyst on thyroid-removed partial thyroid  . Other Maternal Grandfather        TRAIN WRECK  . Colon cancer Neg Hx     Social History Social History   Tobacco Use  . Smoking status: Never Smoker  . Smokeless tobacco: Never Used  Substance Use Topics  . Alcohol use: Yes     Comment: wine-occ  . Drug use: No     Allergies   Patient has no known allergies.   Review of Systems Review of Systems  All other systems reviewed and are negative.    Physical Exam Updated Vital Signs BP 125/85   Pulse (!) 50   Temp 98 F (36.7 C) (Oral)   Resp 15   LMP 08/17/1998   SpO2 99%   Physical Exam  Constitutional: She is oriented to person, place, and time. She appears well-developed and well-nourished.  HENT:  Head: Normocephalic and atraumatic.  Right Ear: External ear normal.  Left Ear: External ear normal.  Nose: Nose normal.  Mouth/Throat: Oropharynx is clear and moist.  Eyes: Pupils are equal, round, and reactive to light. EOM are normal.  Neck: Normal range of motion. Neck supple.  Cardiovascular: An irregular rhythm present. Tachycardia present.  Pulmonary/Chest: Breath sounds normal.  Abdominal: Soft. Bowel sounds are normal.  Musculoskeletal: Normal range of motion.  Neurological: She is oriented to person, place, and time.  Skin: Skin is warm and dry. Capillary refill takes less than 2 seconds.  Nursing note and vitals reviewed.    ED Treatments / Results  Labs (all labs ordered are listed, but only abnormal results are displayed) Labs Reviewed  BASIC METABOLIC PANEL  CBC  I-STAT TROPONIN, ED    EKG EKG Interpretation  Date/Time:  Friday February 04 2018 09:24:11 EDT Ventricular Rate:  154 PR Interval:    QRS Duration: 78 QT Interval:  250 QTC Calculation: 400 R Axis:   75 Text Interpretation:  Atrial fibrillation with rapid ventricular response ?atrial flutter Marked ST abnormality, possible inferior subendocardial injury Abnormal ECG Confirmed by Pattricia Boss [phone]) on 02/04/2018 9:38:53 AM   EKG Interpretation  Date/Time:  Friday February 04 2018 13:54:26 EDT Ventricular Rate:  68 PR Interval:    QRS Duration: 108 QT Interval:  386 QTC Calculation: 411 R Axis:   78 Text Interpretation:  Normal sinus rhythm Non-specific ST-t  changes Anterolateral leads Confirmed by Pattricia Boss [phone]) on 02/04/2018 2:12:34 PM        Radiology No results found.  Procedures Procedures (including critical care time)  Medications Ordered in ED Medications - No data to display   Initial Impression /  Assessment and Plan / ED Course  I have reviewed the triage vital signs and the nursing notes.  Pertinent labs & imaging results that were available during my care of the patient were reviewed by me and considered in my medical decision making (see chart for details).    Tachcyardia- appears to be atrial flutter with rvr. Unknown start time with multiple recent evaluations- does not appear to be candidate for conversion due to thromboembolic risk.  She otherwise appears stable at this time- no hypotension, pain, or other signficant symptoms. Plan rate control with cardizem, anticoagulation, Cardiology consult  [facility] Pharmacist  Discussed treatment with patient  See cardiology consult- home vs admission one hour after flecainide 2:11 PM Patient is converted to normal sinus rhythm 3:00 PM Patietn remains in nsr and stable RX and f/u instructions per cardiology  CRITICAL CARE Performed by: Pattricia Boss Total critical care time: 45 minutes Critical care time was exclusive of separately billable procedures and treating other patients. Critical care was necessary to treat or prevent imminent or life-threatening deterioration. Critical care was time spent personally by me on the following activities: development of treatment plan with patient and/or surrogate as well as nursing, discussions with consultants, evaluation of patient's response to treatment, examination of patient, obtaining history from patient or surrogate, ordering and performing treatments and interventions, ordering and review of laboratory studies, ordering and review of radiographic studies, pulse oximetry and re-evaluation of patient's  condition.  Final Clinical Impressions(s) / ED Diagnoses   Final diagnoses:  Atrial fibrillation with RVR [facility])    ED Discharge Orders    None       Pattricia Boss, MD 02/04/18 1500    Pattricia Boss, MD 02/04/18 1501

## 2018-02-04 NOTE — Consult Note (Addendum)
ELECTROPHYSIOLOGY CONSULT NOTE    Patient ID: Cathy Montoya MRN: [id number], DOB/AGE: Jan 03, 1946 72 y.o.  Admit date: 02/04/2018 Date of Consult: 02/04/2018  Primary Physician: Harlan Stains, MD Primary Cardiologist: Angelena Form Electrophysiologist:Vonita Calloway (new this admission)  Patient Profile: Cathy Montoya is a 72 y.o. female with a history of arthritis, breast cancer, hyperlipidemia, and palpitations who is being seen today for the evaluation of atrial flutter at the request of Dr Jeanell Sparrow.  HPI:  Cathy Montoya is a 72 y.o. female with the above past medical history. She developed palpitations in the fall of 2018 that have become more frequent over time. Her episodes typically last around 12 hours and then self terminate. She is relatively asymptomatic with her arrhythmias and denies chest pain, shortness of breath, dizziness, pre-syncope or syncope. She saw Dr Angelena Form recently.  Event monitor demonstrated no arrhythmias. Echocardiogram was ordered for today. On arrival, she was found to be in typical atrial flutter with RVR and sent to the ER for further evaluation. In the ER, she reports feeling well with some awareness of heart rate but otherwise asymptomatic.  EP has been asked to evaluate for treatment options. She snores but does not think she has sleep apnea, she drinks ETOH occasionally, she is of normal weight and is fairly active.   She denies chest pain, dyspnea, PND, orthopnea, nausea, vomiting, dizziness, syncope, edema, weight gain, or early satiety.  Past Medical History:  Diagnosis Date  . Abnormal Pap smear   . Acne rosacea   . Alopecia    frontal  . Arthritis    knees, lt  hip, lower back, hands, shoulders  . Breast cancer, right ([place]) 04/11/2004   invasive lobular carcinoma treated with 1 chemo treatment and radiation  . Cat allergies   . Intermittent vertigo    FRONTAL FIBROSIS ALOPECIA  . Lipoma   . Shoulder pain, left   . Typical atrial flutter [facility])       Surgical History:  Past Surgical History:  Procedure Laterality Date  . BREAST LUMPECTOMY  01/10/2004   lymph node, chemo x 1/radiation  . COLONOSCOPY    . GYNECOLOGIC CRYOSURGERY  1988   cervical dysplasia      Current Facility-Administered Medications:  .  diltiazem (CARDIZEM) tablet 30 mg, 30 mg, Oral, Once, Lynnell Jude, [employer], NP  Current Outpatient Medications:  .  aspirin EC 81 MG tablet, Take 81 mg by mouth daily., Disp: , Rfl:  .  glucosamine-chondroitin 500-400 MG tablet, Take 1 tablet by mouth daily., Disp: , Rfl:  .  metroNIDAZOLE (METROCREAM) 0.75 % cream, Apply 2.58 application topically as needed for skin breakdown., Disp: , Rfl: 11 .  Multiple Vitamin (MULTIVITAMIN) capsule, Take 1 capsule by mouth daily., Disp: , Rfl:  .  timolol (TIMOPTIC) 0.5 % ophthalmic solution, Place 1 drop into both eyes 2 (two) times daily., Disp: , Rfl: 24  Inpatient Medications:  . diltiazem  30 mg Oral Once    Allergies: No Known Allergies  Social History   Socioeconomic History  . Marital status: Married    Spouse name: Not on file  . Number of children: 2  . Years of education: Not on file  . Highest education level: Not on file  Occupational History  . Occupation: Firefighter  Social Needs  . Financial resource strain: Not on file  . Food insecurity:    Worry: Not on file    Inability: Not on file  . Transportation needs:    Medical:  Not on file    Non-medical: Not on file  Tobacco Use  . Smoking status: Never Smoker  . Smokeless tobacco: Never Used  Substance and Sexual Activity  . Alcohol use: Yes    Comment: wine-occ  . Drug use: No  . Sexual activity: Yes    Partners: Male    Birth control/protection: Post-menopausal  Lifestyle  . Physical activity:    Days per week: Not on file    Minutes per session: Not on file  . Stress: Not on file  Relationships  . Social connections:    Talks on phone: Not on file    Gets together: Not on file    Attends  religious service: Not on file    Active member of club or organization: Not on file    Attends meetings of clubs or organizations: Not on file    Relationship status: Not on file  . Intimate partner violence:    Fear of current or ex partner: Not on file    Emotionally abused: Not on file    Physically abused: Not on file    Forced sexual activity: Not on file  Other Topics Concern  . Not on file  Social History Narrative  . Not on file     Family History  Problem Relation Age of Onset  . Hypertension Father   . Cancer Father        PROSTATE  . Thyroid disease Mother   . Alzheimer's disease Mother   . Breast cancer Sister 31       mental retardation  . Breast cancer Paternal Grandmother   . Cancer Maternal Grandmother        unknown type  . Breast cancer Maternal Grandmother   . Breast cancer Other        aunt  . Cancer Other        aunt, unknown type-had hysterectomy  . Thyroid disease Daughter        cyst on thyroid-removed partial thyroid  . Other Maternal Grandfather        TRAIN WRECK  . Colon cancer Neg Hx      Review of Systems: All other systems reviewed and are otherwise negative except as noted above.  Physical Exam: Vitals:   02/04/18 0942 02/04/18 0945 02/04/18 0947 02/04/18 0948  BP:      Pulse: (!) 111 (!) 167 72 (!) 50  Resp: 13 17 15 15   Temp:      TempSrc:      SpO2: 100% 98% 98% 99%    GEN- The patient is well appearing, alert and oriented x 3 today.   HEENT: normocephalic, atraumatic; sclera clear, conjunctiva pink; hearing intact; oropharynx clear; neck supple Lungs- Clear to ausculation bilaterally, normal work of breathing.  No wheezes, rales, rhonchi Heart- Tachycardic irregular rate and rhythm  GI- soft, non-tender, non-distended, bowel sounds present Extremities- no clubbing, cyanosis, or edema  MS- no significant deformity or atrophy Skin- warm and dry, no rash or lesion Psych- euthymic mood, full affect Neuro- strength and  sensation are intact  Labs:   Lab Results  Component Value Date   WBC 9.2 02/04/2018   HGB 15.1 (H) 02/04/2018   HCT 48.7 (H) 02/04/2018   MCV 94.9 02/04/2018   PLT 228 02/04/2018      Radiology/Studies: No results found.  JSH:FWYOVZC atrial flutter, rate 141 (personally reviewed)  TELEMETRY: atrial flutter with variable AV block (personally reviewed)  Assessment/Plan: 1.  Typical atrial flutter with  RVR She is relatively asymptomatic - only palpitations Pathophysiology, treatment options discussed For now, will give 30mg  of Cardizem to see if she will terminate. She states that her episodes typically self terminate after about 12 hours. This episode started around 4AM this morning.  CHADS2VASC is 2 (age and female sex).  I would lean towards at least short term anticoagulation with plans for ablation - she would like to discuss further with Dr Rayann Heman (she would prefer Xarelto as it is dosed once daily) We discussed ablation as a treatment option today - I think this is reasonable. She will discuss with Dr Rayann Heman.   As she is asymptomatic and hemodynamically stable, I do not think there is need for admission. Will see how she does with po cardizem and hopefully discharge in a couple of hours. Dr Rayann Heman to see later this morning.    Signed, Chanetta Marshall, NP 02/04/2018 10:29 AM  Addendum: Dr Rayann Heman evaluated patient - she is clear that episode started this morning.  Will give Flecainide 300mg  x1 with another Diltiazem 30mg  now.  Start Xarelto 20mg  daily.  If she converts to SR, plan discharge to home with follow up in AF clinic next week.  She will need flutter ablation with Dr Rayann Heman scheduled at that time.   Chanetta Marshall, NP 02/04/2018 12:46 PM  Addendum: Pt converted back to SR with Flecainide. EKG stable. Will plan to discharge home with Xarelto 20mg  daily Flecainide 300mg  and Diltiazem 30mg  x1 for recurrent atrial flutter (do not use more than once per week). Ok to  discharge home after 2 hours of monitoring.  Chanetta Marshall, NP 02/04/2018 1:56 PM  I have seen, examined the patient, and reviewed the above assessment and plan.  Changes to above are made where necessary.  On exam,i RRR.  Pt with typical atrial flutter with RVR.  Will start xarelto. Therapeutic strategies for atrial flutter including medicine and ablation were discussed in detail with the patient today. Risk, benefits, and alternatives to EP study and radiofrequency ablation were also discussed in detail today. These risks include but are not limited to stroke, bleeding, vascular damage, tamponade, perforation, damage to the heart and other structures, AV block requiring pacemaker, worsening renal function, and death. The patient understands these risk and wishes to proceed.  She will return to AF clinic next week.  We will plan to schedule her atrial flutter ablation with me at that time.  Carto and anesthesia are requested for this procedure.   Co Sign: Thompson Grayer, MD 02/04/2018 10:51 PM

## 2018-02-04 NOTE — ED Notes (Signed)
Pt verbalized understanding of d/c instructions and has no further questions, pt to follow up with afib clinic next week. VSS.

## 2018-02-04 NOTE — Progress Notes (Addendum)
ANTICOAGULATION CONSULT NOTE - Initial Consult  Pharmacy Consult for heparin Indication: atrial fibrillation  No Known Allergies  Patient Measurements:   Heparin Dosing Weight: 72kg  Vital Signs: Temp: 98 F (36.7 C) (06/21 0926) Temp Source: Oral (06/21 0926) BP: 125/85 (06/21 0941) Pulse Rate: 50 (06/21 0948)  Labs: Recent Labs    02/04/18 0934  HGB 15.1*  HCT 48.7*  PLT 228    CrCl cannot be calculated (Patient's most recent lab result is older than the maximum 21 days allowed.).   Medical History: Past Medical History:  Diagnosis Date  . Abnormal Pap smear   . Acne rosacea   . Acne-like skin bumps   . Acute left-sided low back pain without sciatica   . Alopecia    frontal  . Arthritis    knees, lt  hip, lower back, hands, shoulders  . Breast cancer, right ([place]) 04/11/2004   invasive lobular carcinoma treated with 1 chemo treatment and radiation  . Cat allergies   . Chest tightness   . Heart murmur    diagnosed during pregnancy  . Impacted cerumen of left ear   . Intermittent vertigo    FRONTAL FIBROSIS ALOPECIA  . Left hip pain   . Lipoma   . Palpitations   . Shoulder pain, left      Assessment: 24 yoF admitted with rapid AFib. Pt takes no OAC PTA, CBC wnl.  Goal of Therapy:  Heparin level 0.3-0.7 units/ml Monitor platelets by anticoagulation protocol: Yes   Plan:  -Heparin 4000 units x1 -Heparin 1050 units/hr -Check 8-hr heparin level -Monitor heparin level, CBC, S/Sx bleeding daily  ADDENDUM: No IV heparin per EP. Pharmacy will sign off, reconsult as needed.  Arrie Senate, PharmD, BCPS PGY-2 Cardiology Pharmacy Resident Clinical Phone: [phone] 02/04/2018

## 2018-02-04 NOTE — ED Notes (Signed)
Pt heart rate in 70's - Cardiology at bedside. Meds explained and discharge discussed

## 2018-02-04 NOTE — ED Notes (Signed)
Patient reports she feels like she was getting more sweaty. MD at bedside. HR 172 MD aware

## 2018-02-04 NOTE — ED Notes (Signed)
Cardiology provider at bedside

## 2018-02-10 ENCOUNTER — Encounter (HOSPITAL_COMMUNITY): Payer: Self-pay | Admitting: Nurse Practitioner

## 2018-02-10 ENCOUNTER — Ambulatory Visit (HOSPITAL_COMMUNITY)
Admission: RE | Admit: 2018-02-10 | Discharge: 2018-02-10 | Disposition: A | Discharge status: Home / Self Care | Payer: Medicare Other | Source: Ambulatory Visit | Attending: Nurse Practitioner | Admitting: Nurse Practitioner

## 2018-02-10 ENCOUNTER — Other Ambulatory Visit: Payer: Self-pay

## 2018-02-10 VITALS — BP 124/76 | HR 64 | Ht 68.5 in | Wt 156.4 lb

## 2018-02-10 DIAGNOSIS — L719 Rosacea, unspecified: Secondary | ICD-10-CM | POA: Insufficient documentation

## 2018-02-10 DIAGNOSIS — Z7902 Long term (current) use of antithrombotics/antiplatelets: Secondary | ICD-10-CM | POA: Insufficient documentation

## 2018-02-10 DIAGNOSIS — Z853 Personal history of malignant neoplasm of breast: Secondary | ICD-10-CM | POA: Diagnosis not present

## 2018-02-10 DIAGNOSIS — Z79899 Other long term (current) drug therapy: Secondary | ICD-10-CM | POA: Diagnosis not present

## 2018-02-10 DIAGNOSIS — L659 Nonscarring hair loss, unspecified: Secondary | ICD-10-CM | POA: Insufficient documentation

## 2018-02-10 DIAGNOSIS — I483 Typical atrial flutter: Secondary | ICD-10-CM | POA: Diagnosis not present

## 2018-02-10 MED ORDER — RIVAROXABAN 20 MG PO TABS: 20.0000 mg | ORAL_TABLET | Freq: Every day | ORAL | 3 refills | Status: DC

## 2018-02-10 NOTE — Progress Notes (Signed)
Primary Care Physician: Harlan Stains, MD Referring Physician: Dr. Valente Cathy Montoya is a 72 y.o. female with a h/o atrail flutter, recently treated in the ER, converted with flecainide 300 mg and sent to Afib clinic for f/u.She has had multiple episodes of palpitations over the past several months.  She had a monitor placed by her primary care doctor in April that showed only bradycardia.  She was scheduled to see cardiology this month and have an echocardiogram today.  She presented for echocardiogram and was noted to be and what was thought to be atrial flutter with 2-1 block.  Subsequently, she was sent to the ED for evaluation.  She states that sometimes she gets some nausea but denies other symptoms with this.  She states is been happening intermittently and last sometimes for several hours.  She thinks she was last in a normal rhythm sometime during the night.   Dr. [name] did see in consult and he  suggested typical atrial flutter and pt wants to go ahead and get scheduled for later in summer as she has a few vacations coming up.CHADS2VASC is 2 (age and female sex) and is on xarelto 20 mg daily in preparation for her ablation. She denies snoring and does not drink significant alcohol or caffeine. She has prn flecainide 300 mg x 1 + 30 mg cardizem  to use for repeat events.   Today, she denies symptoms of palpitations, chest pain, shortness of breath, orthopnea, PND, lower extremity edema, dizziness, presyncope, syncope, or neurologic sequela. The patient is tolerating medications without difficulties and is otherwise without complaint today.   Past Medical History:  Diagnosis Date  . Abnormal Pap smear   . Acne rosacea   . Alopecia    frontal  . Arthritis    knees, lt  hip, lower back, hands, shoulders  . Breast cancer, right ([place]) 04/11/2004   invasive lobular carcinoma treated with 1 chemo treatment and radiation  . Cat allergies   . Intermittent vertigo    FRONTAL FIBROSIS  ALOPECIA  . Lipoma   . Shoulder pain, left   . Typical atrial flutter [facility])    Past Surgical History:  Procedure Laterality Date  . BREAST LUMPECTOMY  01/10/2004   lymph node, chemo x 1/radiation  . COLONOSCOPY    . GYNECOLOGIC CRYOSURGERY  1988   cervical dysplasia    Current Outpatient Medications  Medication Sig Dispense Refill  . metroNIDAZOLE (METROCREAM) 0.75 % cream Apply 4.09 application topically as needed for skin breakdown.  11  . Multiple Vitamin (MULTIVITAMIN) capsule Take 1 capsule by mouth daily.    . rivaroxaban (XARELTO) 20 MG TABS tablet Take 1 tablet (20 mg total) by mouth daily with supper. 30 tablet 3  . tetrahydrozoline-zinc (VISINE-AC) 0.05-0.25 % ophthalmic solution Place 2 drops into both eyes as needed (dry & itchy eyes).    . timolol (TIMOPTIC) 0.5 % ophthalmic solution Place 1 drop into both eyes 2 (two) times daily.  24  . diltiazem (CARDIZEM) 30 MG tablet Take 1 tablet (30 mg total) by mouth once for 1 dose. Take with Flecainide (Patient not taking: Reported on 02/10/2018) 4 tablet 0  . flecainide (TAMBOCOR) 100 MG tablet Take 3 tablets (300 mg total) by mouth once for 1 dose. As needed for tachycardia. Do not take more than once per week. (Patient not taking: Reported on 02/10/2018) 12 tablet 0  . glucosamine-chondroitin 500-400 MG tablet Take 1 tablet by mouth daily.  No current facility-administered medications for this encounter.     No Known Allergies  Social History   Socioeconomic History  . Marital status: Married    Spouse name: Not on file  . Number of children: 2  . Years of education: Not on file  . Highest education level: Not on file  Occupational History  . Occupation: Firefighter  Social Needs  . Financial resource strain: Not on file  . Food insecurity:    Worry: Not on file    Inability: Not on file  . Transportation needs:    Medical: Not on file    Non-medical: Not on file  Tobacco Use  . Smoking status: Never  Smoker  . Smokeless tobacco: Never Used  Substance and Sexual Activity  . Alcohol use: Yes    Comment: wine-occ  . Drug use: No  . Sexual activity: Yes    Partners: Male    Birth control/protection: Post-menopausal  Lifestyle  . Physical activity:    Days per week: Not on file    Minutes per session: Not on file  . Stress: Not on file  Relationships  . Social connections:    Talks on phone: Not on file    Gets together: Not on file    Attends religious service: Not on file    Active member of club or organization: Not on file    Attends meetings of clubs or organizations: Not on file    Relationship status: Not on file  . Intimate partner violence:    Fear of current or ex partner: Not on file    Emotionally abused: Not on file    Physically abused: Not on file    Forced sexual activity: Not on file  Other Topics Concern  . Not on file  Social History Narrative  . Not on file    Family History  Problem Relation Age of Onset  . Hypertension Father   . Cancer Father        PROSTATE  . Thyroid disease Mother   . Alzheimer's disease Mother   . Breast cancer Sister 3       mental retardation  . Breast cancer Paternal Grandmother   . Cancer Maternal Grandmother        unknown type  . Breast cancer Maternal Grandmother   . Breast cancer Other        aunt  . Cancer Other        aunt, unknown type-had hysterectomy  . Thyroid disease Daughter        cyst on thyroid-removed partial thyroid  . Other Maternal Grandfather        TRAIN WRECK  . Colon cancer Neg Hx     ROS- All systems are reviewed and negative except as per the HPI above  Physical Exam: Vitals:   02/10/18 1057  BP: 124/76  Pulse: 64  Weight: 156 lb 6.4 oz (70.9 kg)  Height: 5' 8.5" (1.74 m)   Wt Readings from Last 3 Encounters:  02/10/18 156 lb 6.4 oz (70.9 kg)  01/25/18 159 lb (72.1 kg)  01/01/17 161 lb (73 kg)    Labs: Lab Results  Component Value Date   NA 142 02/04/2018   K 4.4  02/04/2018   CL 105 02/04/2018   CO2 28 02/04/2018   GLUCOSE 95 02/04/2018   BUN 23 (H) 02/04/2018   CREATININE 0.99 02/04/2018   CALCIUM 10.0 02/04/2018   No results found for: INR No results found for: CHOL,  HDL, LDLCALC, TRIG   GEN- The patient is well appearing, alert and oriented x 3 today.   Head- normocephalic, atraumatic Eyes-  Sclera clear, conjunctiva pink Ears- hearing intact Oropharynx- clear Neck- supple, no JVP Lymph- no cervical lymphadenopathy Lungs- Clear to ausculation bilaterally, normal work of breathing Heart- Regular rate and rhythm, no murmurs, rubs or gallops, PMI not laterally displaced GI- soft, NT, ND, + BS Extremities- no clubbing, cyanosis, or edema MS- no significant deformity or atrophy Skin- no rash or lesion Psych- euthymic mood, full affect Neuro- strength and sensation are intact  EKG- NSR at 64 bpm    Assessment and Plan: 1. Typical atrial flutter Successful cardioversion with flecainide 300 mg + 30 cardizem in the  ER 6/21 She does want to pursue flutter ablation with Dr. [name] and will get this scheduled for later this summer In the interim, she will continue xarelto 20 mg daily for chadsvasc score of 2 Review of how to  use prn flecainide discussed with pt  Pt will be notified when the procedure is scheduled   Cathy Montoya, [facility] [address] [place], [place] [address] [phone]

## 2018-02-11 ENCOUNTER — Other Ambulatory Visit (HOSPITAL_COMMUNITY): Payer: Self-pay | Admitting: *Deleted

## 2018-02-11 DIAGNOSIS — I483 Typical atrial flutter: Secondary | ICD-10-CM

## 2018-02-22 ENCOUNTER — Ambulatory Visit (HOSPITAL_COMMUNITY): Payer: Medicare Other | Attending: Cardiovascular Disease

## 2018-02-22 ENCOUNTER — Other Ambulatory Visit: Payer: Self-pay

## 2018-02-22 DIAGNOSIS — I071 Rheumatic tricuspid insufficiency: Secondary | ICD-10-CM | POA: Diagnosis not present

## 2018-02-22 DIAGNOSIS — R0602 Shortness of breath: Secondary | ICD-10-CM | POA: Diagnosis present

## 2018-02-22 DIAGNOSIS — R002 Palpitations: Secondary | ICD-10-CM

## 2018-03-17 ENCOUNTER — Other Ambulatory Visit: Payer: Self-pay | Admitting: Obstetrics & Gynecology

## 2018-03-17 DIAGNOSIS — Z1231 Encounter for screening mammogram for malignant neoplasm of breast: Secondary | ICD-10-CM

## 2018-03-22 ENCOUNTER — Encounter

## 2018-03-22 ENCOUNTER — Ambulatory Visit: Payer: Medicare Other | Admitting: Obstetrics & Gynecology

## 2018-04-15 ENCOUNTER — Other Ambulatory Visit: Payer: Medicare Other | Admitting: *Deleted

## 2018-04-15 DIAGNOSIS — I483 Typical atrial flutter: Secondary | ICD-10-CM

## 2018-04-15 LAB — CBC WITH DIFFERENTIAL/PLATELET
Basophils Absolute: 0.1 10*3/uL (ref 0.0–0.2)
Basos: 1 %
EOS (ABSOLUTE): 0.2 10*3/uL (ref 0.0–0.4)
EOS: 2 %
HEMATOCRIT: 43.2 % (ref 34.0–46.6)
Hemoglobin: 14 g/dL (ref 11.1–15.9)
IMMATURE GRANULOCYTES: 0 %
Immature Grans (Abs): 0 10*3/uL (ref 0.0–0.1)
LYMPHS ABS: 2 10*3/uL (ref 0.7–3.1)
Lymphs: 24 %
MCH: 29.7 pg (ref 26.6–33.0)
MCHC: 32.4 g/dL (ref 31.5–35.7)
MCV: 92 fL (ref 79–97)
MONOS ABS: 0.9 10*3/uL (ref 0.1–0.9)
Monocytes: 11 %
NEUTROS PCT: 62 %
Neutrophils Absolute: 5.2 10*3/uL (ref 1.4–7.0)
Platelets: 190 10*3/uL (ref 150–450)
RBC: 4.72 x10E6/uL (ref 3.77–5.28)
RDW: 12.7 % (ref 12.3–15.4)
WBC: 8.3 10*3/uL (ref 3.4–10.8)

## 2018-04-15 LAB — BASIC METABOLIC PANEL
BUN/Creatinine Ratio: 22 (ref 12–28)
BUN: 20 mg/dL (ref 8–27)
CALCIUM: 10.3 mg/dL (ref 8.7–10.3)
CO2: 25 mmol/L (ref 20–29)
Chloride: 104 mmol/L (ref 96–106)
Creatinine, Ser: 0.93 mg/dL (ref 0.57–1.00)
GFR calc Af Amer: 72 mL/min/{1.73_m2} (ref >59)
GFR, EST NON AFRICAN AMERICAN: 62 mL/min/{1.73_m2} (ref >59)
Glucose: 95 mg/dL (ref 65–99)
Potassium: 4.6 mmol/L (ref 3.5–5.2)
Sodium: 140 mmol/L (ref 134–144)

## 2018-04-21 ENCOUNTER — Encounter (HOSPITAL_COMMUNITY)
Admission: RE | Disposition: A | Discharge status: Home / Self Care | Payer: Self-pay | Source: Ambulatory Visit | Attending: Internal Medicine

## 2018-04-21 ENCOUNTER — Ambulatory Visit (HOSPITAL_COMMUNITY)
Admission: RE | Admit: 2018-04-21 | Discharge: 2018-04-21 | Disposition: A | Discharge status: Home / Self Care | Payer: Medicare Other | Source: Ambulatory Visit | Attending: Internal Medicine | Admitting: Internal Medicine

## 2018-04-21 ENCOUNTER — Ambulatory Visit (HOSPITAL_COMMUNITY): Payer: Medicare Other | Admitting: Anesthesiology

## 2018-04-21 ENCOUNTER — Encounter (HOSPITAL_COMMUNITY): Payer: Self-pay | Admitting: Anesthesiology

## 2018-04-21 DIAGNOSIS — Z91048 Other nonmedicinal substance allergy status: Secondary | ICD-10-CM | POA: Insufficient documentation

## 2018-04-21 DIAGNOSIS — Z923 Personal history of irradiation: Secondary | ICD-10-CM | POA: Insufficient documentation

## 2018-04-21 DIAGNOSIS — Z853 Personal history of malignant neoplasm of breast: Secondary | ICD-10-CM | POA: Insufficient documentation

## 2018-04-21 DIAGNOSIS — I4892 Unspecified atrial flutter: Secondary | ICD-10-CM | POA: Diagnosis not present

## 2018-04-21 DIAGNOSIS — Z9889 Other specified postprocedural states: Secondary | ICD-10-CM | POA: Diagnosis not present

## 2018-04-21 DIAGNOSIS — I4891 Unspecified atrial fibrillation: Secondary | ICD-10-CM

## 2018-04-21 DIAGNOSIS — L669 Cicatricial alopecia, unspecified: Secondary | ICD-10-CM | POA: Insufficient documentation

## 2018-04-21 DIAGNOSIS — M1990 Unspecified osteoarthritis, unspecified site: Secondary | ICD-10-CM | POA: Insufficient documentation

## 2018-04-21 DIAGNOSIS — I483 Typical atrial flutter: Secondary | ICD-10-CM

## 2018-04-21 DIAGNOSIS — Z9221 Personal history of antineoplastic chemotherapy: Secondary | ICD-10-CM | POA: Insufficient documentation

## 2018-04-21 HISTORY — PX: A-FLUTTER ABLATION: EP1230

## 2018-04-21 SURGERY — A-FLUTTER ABLATION
Anesthesia: Monitor Anesthesia Care

## 2018-04-21 MED ORDER — MAGNESIUM SULFATE 50 % IJ SOLN
INTRAMUSCULAR | Status: AC
  Filled 2018-04-21: qty 2

## 2018-04-21 MED ORDER — IBUTILIDE FUMARATE 1 MG/10ML IV SOLN
1.0000 mg | Freq: Once | INTRAVENOUS | Status: DC
  Filled 2018-04-21: qty 10

## 2018-04-21 MED ORDER — PROPOFOL 10 MG/ML IV BOLUS
INTRAVENOUS | Status: DC | PRN
  Administered 2018-04-21 (×2): 20 mg via INTRAVENOUS

## 2018-04-21 MED ORDER — SODIUM CHLORIDE 0.9% FLUSH: 3.0000 mL | Freq: Two times a day (BID) | INTRAVENOUS | Status: DC

## 2018-04-21 MED ORDER — BUPIVACAINE HCL (PF) 0.25 % IJ SOLN
INTRAMUSCULAR | Status: AC
  Filled 2018-04-21: qty 30

## 2018-04-21 MED ORDER — ACETAMINOPHEN 325 MG PO TABS: 650.0000 mg | ORAL_TABLET | ORAL | Status: DC | PRN

## 2018-04-21 MED ORDER — PROPOFOL 500 MG/50ML IV EMUL
INTRAVENOUS | Status: DC | PRN
  Administered 2018-04-21: 75 ug/kg/min via INTRAVENOUS

## 2018-04-21 MED ORDER — MIDAZOLAM HCL 5 MG/5ML IJ SOLN: INTRAMUSCULAR | Status: DC | PRN

## 2018-04-21 MED ORDER — IBUTILIDE FUMARATE 1 MG/10ML IV SOLN
1.0000 mg | Freq: Once | INTRAVENOUS | Status: DC
  Administered 2018-04-21: 1 mg via INTRAVENOUS

## 2018-04-21 MED ORDER — MAGNESIUM SULFATE 50 % IJ SOLN
1.0000 g | Freq: Once | INTRAMUSCULAR | Status: AC
  Administered 2018-04-21: 2 g via INTRAVENOUS

## 2018-04-21 MED ORDER — LIDOCAINE 2% (20 MG/ML) 5 ML SYRINGE
INTRAMUSCULAR | Status: DC | PRN
  Administered 2018-04-21: 50 mg via INTRAVENOUS

## 2018-04-21 MED ORDER — DILTIAZEM HCL 60 MG PO TABS
60.0000 mg | ORAL_TABLET | Freq: Once | ORAL | Status: AC
  Administered 2018-04-21: 60 mg via ORAL
  Filled 2018-04-21: qty 1

## 2018-04-21 MED ORDER — ONDANSETRON HCL 4 MG/2ML IJ SOLN: 4.0000 mg | Freq: Four times a day (QID) | INTRAMUSCULAR | Status: DC | PRN

## 2018-04-21 MED ORDER — FLECAINIDE ACETATE 100 MG PO TABS
300.0000 mg | ORAL_TABLET | Freq: Once | ORAL | Status: AC
  Administered 2018-04-21: 300 mg via ORAL
  Filled 2018-04-21: qty 3

## 2018-04-21 MED ORDER — MIDAZOLAM HCL 5 MG/5ML IJ SOLN
INTRAMUSCULAR | Status: DC | PRN
  Administered 2018-04-21: 2 mg via INTRAVENOUS

## 2018-04-21 MED ORDER — IBUTILIDE FUMARATE 1 MG/10ML IV SOLN
INTRAVENOUS | Status: AC
  Filled 2018-04-21: qty 10

## 2018-04-21 MED ORDER — BUPIVACAINE HCL (PF) 0.25 % IJ SOLN
INTRAMUSCULAR | Status: DC | PRN
  Administered 2018-04-21: 30 mL

## 2018-04-21 MED ORDER — HYDROCODONE-ACETAMINOPHEN 5-325 MG PO TABS: 1.0000 | ORAL_TABLET | ORAL | Status: DC | PRN

## 2018-04-21 MED ORDER — FENTANYL CITRATE (PF) 100 MCG/2ML IJ SOLN: 25.0000 ug | INTRAMUSCULAR | Status: DC | PRN

## 2018-04-21 MED ORDER — ONDANSETRON HCL 4 MG/2ML IJ SOLN
INTRAMUSCULAR | Status: DC | PRN
  Administered 2018-04-21: 4 mg via INTRAVENOUS

## 2018-04-21 MED ORDER — FENTANYL CITRATE (PF) 100 MCG/2ML IJ SOLN
INTRAMUSCULAR | Status: DC | PRN
  Administered 2018-04-21 (×4): 25 ug via INTRAVENOUS

## 2018-04-21 MED ORDER — SODIUM CHLORIDE 0.9 % IV SOLN: 250.0000 mL | INTRAVENOUS | Status: DC | PRN

## 2018-04-21 MED ORDER — SODIUM CHLORIDE 0.9 % IV SOLN
INTRAVENOUS | Status: DC
  Administered 2018-04-21: 07:00:00 via INTRAVENOUS

## 2018-04-21 MED ORDER — SODIUM CHLORIDE 0.9% FLUSH: 3.0000 mL | INTRAVENOUS | Status: DC | PRN

## 2018-04-21 SURGICAL SUPPLY — 10 items
CATH EZ STEER NAV 8MM F-J CUR (ABLATOR) ×2 IMPLANT
CATH WEBSTER BI DIR CS D-F CRV (CATHETERS) ×2 IMPLANT
PACK EP LATEX FREE (CUSTOM PROCEDURE TRAY) ×3
PACK EP LF (CUSTOM PROCEDURE TRAY) ×1 IMPLANT
PAD PRO RADIOLUCENT 2001M-C (PAD) ×3 IMPLANT
PATCH CARTO3 (PAD) ×2 IMPLANT
SHEATH PINNACLE 7F 10CM (SHEATH) ×2 IMPLANT
SHEATH PINNACLE 8F 10CM (SHEATH) ×2 IMPLANT
SHEATH PINNACLE 9F 10CM (SHEATH) ×2 IMPLANT
SHEATH SWARTZ RAMP 8.5F 60CM (SHEATH) ×2 IMPLANT

## 2018-04-21 NOTE — Transfer of Care (Signed)
Immediate Anesthesia Transfer of Care Note  Patient: Cherita S Helget  Procedure(s) Performed: A-FLUTTER ABLATION (N/A )  Patient Location: Cath Lab  Anesthesia Type:MAC  Level of Consciousness: awake, oriented and patient cooperative  Airway & Oxygen Therapy: Patient Spontanous Breathing and Patient connected to nasal cannula oxygen  Post-op Assessment: Report given to RN and Post -op Vital signs reviewed and stable  Post vital signs: Reviewed  Last Vitals:  Vitals Value Taken Time  BP 154/97 04/21/2018 10:16 AM  Temp    Pulse 138 04/21/2018 10:24 AM  Resp 20 04/21/2018 10:25 AM  SpO2 95 % 04/21/2018 10:24 AM  Vitals shown include unvalidated device data.  Last Pain:  Vitals:   04/21/18 1000  TempSrc: Temporal  PainSc:       Patients Stated Pain Goal: 3 (65/99/35 7017)  Complications: No apparent anesthesia complications

## 2018-04-21 NOTE — Progress Notes (Addendum)
Site area: RFV x 2 Site Prior to Removal:  Level 0 Pressure Applied For: 25 min Manual:  yes  Patient Status During Pull:  stable Post Pull Site:  Level Post Pull Instructions Given: yes  Post Pull Pulses Present: palpable PT Dressing Applied:  clear Bedrest begins @ 1015 till 1615 Comments:

## 2018-04-21 NOTE — Anesthesia Procedure Notes (Signed)
Procedure Name: MAC Date/Time: 04/21/2018 7:55 AM Performed by: Jenne Campus, CRNA Pre-anesthesia Checklist: Patient identified, Emergency Drugs available, Suction available and Patient being monitored Oxygen Delivery Method: Simple face mask

## 2018-04-21 NOTE — Progress Notes (Signed)
EKG complete as request by Dr Rayann Heman. EKG shown to Dr Rayann Heman states ok to d/c home when bedrest is up.

## 2018-04-21 NOTE — Discharge Instructions (Signed)
Venogram post  Care After This sheet gives you information about how to care for yourself after your procedure. Your doctor may also give you more specific instructions. If you have problems or questions, contact your doctor. Follow these instructions at home: Insertion site care  Follow instructions from your doctor about how to take care of your long, thin tube (catheter) insertion area. Make sure you: ? Wash your hands with soap and water before you change your bandage (dressing). If you cannot use soap and water, use hand sanitizer. ? Change your bandage as told by your doctor. ? Leave stitches (sutures), skin glue, or skin tape (adhesive) strips in place. They may need to stay in place for 2 weeks or longer. If tape strips get loose and curl up, you may trim the loose edges. Do not remove tape strips completely unless your doctor says it is okay.  Do not take baths, swim, or use a hot tub until your doctor says it is okay.  You may shower 24-48 hours after the procedure or as told by your doctor. ? Gently wash the area with plain soap and water. ? Pat the area dry with a clean towel. ? Do not rub the area. This may cause bleeding.  Do not apply powder or lotion to the area. Keep the area clean and dry.  Check your insertion area every day for signs of infection. Check for: ? More redness, swelling, or pain. ? Fluid or blood. ? Warmth. ? Pus or a bad smell. Activity  Rest as told by your doctor, usually for 1-2 days.  Do not lift anything that is heavier than 10 lbs. (4.5 kg) or as told by your doctor.  Do not drive for 24 hours if you were given a medicine to help you relax (sedative).  Do not drive or use heavy machinery while taking prescription pain medicine. General instructions  Go back to your normal activities as told by your doctor, usually in about a week. Ask your doctor what activities are safe for you.  If the insertion area starts to bleed, lie flat and put  pressure on the area. If the bleeding does not stop, get help right away. This is an emergency.  Drink enough fluid to keep your pee (urine) clear or pale yellow.  Take over-the-counter and prescription medicines only as told by your doctor.  Keep all follow-up visits as told by your doctor. This is important. Contact a doctor if:  You have a fever.  You have chills.  You have more redness, swelling, or pain around your insertion area.  You have fluid or blood coming from your insertion area.  The insertion area feels warm to the touch.  You have pus or a bad smell coming from your insertion area.  You have more bruising around the insertion area.  Blood collects in the tissue around the insertion area (hematoma) that may be painful to the touch. Get help right away if:  You have a lot of pain in the insertion area.  The insertion area swells very fast.  The insertion area is bleeding, and the bleeding does not stop after holding steady pressure on the area.  The area near or just beyond the insertion area becomes pale, cool, tingly, or numb. These symptoms may be an emergency. Do not wait to see if the symptoms will go away. Get medical help right away. Call your local emergency services (911 in the [place] Do not drive yourself to the  hospital. Summary  After the procedure, it is common to have bruising and tenderness at the long, thin tube insertion area.  After the procedure, it is important to rest and drink plenty of fluids.  Do not take baths, swim, or use a hot tub until your doctor says it is okay to do so. You may shower 24-48 hours after the procedure or as told by your doctor.  If the insertion area starts to bleed, lie flat and put pressure on the area. If the bleeding does not stop, get help right away. This is an emergency. This information is not intended to replace advice given to you by your health care provider. Make sure you discuss any questions you have  with your health care provider. Document Released: 10/30/2008 Document Revised: 07/28/2016 Document Reviewed: 07/28/2016 Elsevier Interactive Patient Education  2017 [employer].

## 2018-04-21 NOTE — Anesthesia Postprocedure Evaluation (Signed)
Anesthesia Post Note  Patient: Cathy Montoya  Procedure(s) Performed: A-FLUTTER ABLATION (N/A )     Patient location during evaluation: Endoscopy Anesthesia Type: MAC Level of consciousness: awake Pain management: pain level controlled Vital Signs Assessment: post-procedure vital signs reviewed and stable Respiratory status: spontaneous breathing Cardiovascular status: stable Postop Assessment: adequate PO intake Anesthetic complications: no    Last Vitals:  Vitals:   04/21/18 1016 04/21/18 1049  BP: (!) 154/97 (!) 145/81  Pulse: (!) 132 (!) 129  Resp: 14 16  Temp:  36.6 C  SpO2: 94% 94%    Last Pain:  Vitals:   04/21/18 1049  TempSrc: Oral  PainSc: 0-No pain                 Jayra Choyce

## 2018-04-21 NOTE — H&P (Signed)
   PCP: Harlan Stains, MD Primary EP: Dr Lynwood Dawley Cathy Montoya is a 72 y.o. female who presents today for electrophysiology study and ablation of atrial flutter.  Since last being seen in our clinic, the patient reports doing very well.  Today, she denies symptoms of palpitations, chest pain, shortness of breath,  lower extremity edema, dizziness, presyncope, or syncope.  The patient is otherwise without complaint today.   Past Medical History:  Diagnosis Date  . Abnormal Pap smear   . Acne rosacea   . Alopecia    frontal  . Arthritis    knees, lt  hip, lower back, hands, shoulders  . Breast cancer, right ([place]) 04/11/2004   invasive lobular carcinoma treated with 1 chemo treatment and radiation  . Cat allergies   . Intermittent vertigo    FRONTAL FIBROSIS ALOPECIA  . Lipoma   . Shoulder pain, left   . Typical atrial flutter [facility])    Past Surgical History:  Procedure Laterality Date  . BREAST LUMPECTOMY  01/10/2004   lymph node, chemo x 1/radiation  . COLONOSCOPY    . GYNECOLOGIC CRYOSURGERY  1988   cervical dysplasia    ROS- all systems are reviewed and negatives except as per HPI above  Home medicines are reviewed  Physical Exam: Vitals:   04/21/18 0555  BP: (!) 148/63  Pulse: 65  Resp: 18  Temp: 98.6 F (37 C)  TempSrc: Oral  SpO2: 99%  Weight: 71.2 kg  Height: 5' 8.5" (1.74 m)    GEN- The patient is well appearing, alert and oriented x 3 today.   Head- normocephalic, atraumatic Eyes-  Sclera clear, conjunctiva pink Ears- hearing intact Oropharynx- clear Lungs- Clear to ausculation bilaterally, normal work of breathing Heart- Regular rate and rhythm, no murmurs, rubs or gallops, PMI not laterally displaced GI- soft, NT, ND, + BS Extremities- no clubbing, cyanosis, or edema  Wt Readings from Last 3 Encounters:  04/21/18 71.2 kg  02/10/18 70.9 kg  01/25/18 72.1 kg      Assessment and Plan:  1. Typical atrial flutter Therapeutic strategies for  atrial flutter including medicine and ablation were discussed in detail with the patient today. Risk, benefits, and alternatives to EP study and radiofrequency ablation were also discussed in detail today. These risks include but are not limited to stroke, bleeding, vascular damage, tamponade, perforation, damage to the heart and other structures, AV block requiring pacemaker, worsening renal function, and death. The patient understands these risk and wishes to proceed. She reports compliance with xarelto without interruption.   Thompson Grayer MD, [facility] 04/21/2018 7:29 AM

## 2018-04-21 NOTE — Progress Notes (Signed)
Dr Allred in to see pt  

## 2018-04-21 NOTE — Anesthesia Preprocedure Evaluation (Signed)
Anesthesia Evaluation  Patient identified by MRN, date of birth, ID band Patient awake    Reviewed: Allergy & Precautions, NPO status , Patient's Chart, lab work & pertinent test results  Airway Mallampati: II  TM Distance: >3 FB     Dental   Pulmonary    breath sounds clear to auscultation       Cardiovascular + dysrhythmias Atrial Fibrillation + Valvular Problems/Murmurs  Rhythm:Regular Rate:Normal     Neuro/Psych    GI/Hepatic negative GI ROS, Neg liver ROS,   Endo/Other    Renal/GU negative Renal ROS     Musculoskeletal   Abdominal   Peds  Hematology   Anesthesia Other Findings   Reproductive/Obstetrics                             Anesthesia Physical Anesthesia Plan  ASA: III  Anesthesia Plan: MAC   Post-op Pain Management:    Induction: Intravenous  PONV Risk Score and Plan:   Airway Management Planned: Simple Face Mask and Nasal Cannula  Additional Equipment:   Intra-op Plan:   Post-operative Plan:   Informed Consent: I have reviewed the patients History and Physical, chart, labs and discussed the procedure including the risks, benefits and alternatives for the proposed anesthesia with the patient or authorized representative who has indicated his/her understanding and acceptance.   Dental advisory given  Plan Discussed with: CRNA and Anesthesiologist  Anesthesia Plan Comments:         Anesthesia Quick Evaluation

## 2018-04-22 ENCOUNTER — Encounter (HOSPITAL_COMMUNITY): Payer: Self-pay | Admitting: Internal Medicine

## 2018-04-28 ENCOUNTER — Ambulatory Visit
Admission: RE | Admit: 2018-04-28 | Discharge: 2018-04-28 | Disposition: A | Discharge status: Home / Self Care | Payer: Medicare Other | Source: Ambulatory Visit | Attending: Obstetrics & Gynecology | Admitting: Obstetrics & Gynecology

## 2018-04-28 DIAGNOSIS — Z1231 Encounter for screening mammogram for malignant neoplasm of breast: Secondary | ICD-10-CM

## 2018-05-10 ENCOUNTER — Encounter: Payer: Self-pay | Admitting: Internal Medicine

## 2018-05-23 ENCOUNTER — Other Ambulatory Visit: Payer: Self-pay

## 2018-05-23 ENCOUNTER — Ambulatory Visit (INDEPENDENT_AMBULATORY_CARE_PROVIDER_SITE_OTHER): Payer: Medicare Other | Admitting: Obstetrics & Gynecology

## 2018-05-23 ENCOUNTER — Encounter: Payer: Self-pay | Admitting: Obstetrics & Gynecology

## 2018-05-23 VITALS — BP 130/60 | HR 68 | Resp 14 | Ht 68.25 in | Wt 158.0 lb

## 2018-05-23 DIAGNOSIS — Z01419 Encounter for gynecological examination (general) (routine) without abnormal findings: Secondary | ICD-10-CM | POA: Diagnosis not present

## 2018-05-23 MED ORDER — NONFORMULARY OR COMPOUNDED ITEM: 4 refills | Status: DC

## 2018-05-23 NOTE — Progress Notes (Signed)
72 y.o. G2P2 Married White or Caucasian female here for annual exam.  Doing well.  Diagnosed with afib this year.  Had an ablation in early September.  Currently on Xarelto, diltiazem, and flecainide.  Has follow up appt this week with Dr. Rayann Heman.  Has not felt any irregular rates or rhythms.    Denies vaginal bleeding.    PCP:  Dr. Dema Severin.  Last appt was in March.  Patient's last menstrual period was 08/17/1998.          Sexually active: Yes.    The current method of family planning is post menopausal status.    Exercising: No.  walk Smoker:  no  Health Maintenance: Pap:  01/01/17 Neg   08/28/14 Neg  History of abnormal Pap:  Yes, Cryo MMG:  04/28/18 BIRADS1:neg  Colonoscopy:  01/16/16 Normal.  Dr. Carlean Purl who advised no f/u needed BMD:   04/08/17 Osteopenia  TDaP:  2012 Pneumonia vaccine(s):  PCP Shingrix:   Completed Zostavax Hep C testing: [facility] Screening Labs: PCP   reports that she has never smoked. She has never used smokeless tobacco. She reports that she drinks alcohol. She reports that she does not use drugs.  Past Medical History:  Diagnosis Date  . Abnormal Pap smear   . Acne rosacea   . Alopecia    frontal  . Arthritis    knees, lt  hip, lower back, hands, shoulders  . Breast cancer, right ([place]) 04/11/2004   invasive lobular carcinoma treated with 1 chemo treatment and radiation  . Cat allergies   . Intermittent vertigo    FRONTAL FIBROSIS ALOPECIA  . Lipoma   . Shoulder pain, left   . Typical atrial flutter [facility])     Past Surgical History:  Procedure Laterality Date  . A-FLUTTER ABLATION N/A 04/21/2018   Procedure: A-FLUTTER ABLATION;  Surgeon: Thompson Grayer, MD;  Location: [place] CV LAB;  Service: Cardiovascular;  Laterality: N/A;  . BREAST LUMPECTOMY  01/10/2004   lymph node, chemo x 1/radiation  . COLONOSCOPY    . GYNECOLOGIC CRYOSURGERY  1988   cervical dysplasia    Current Outpatient Medications  Medication Sig Dispense Refill  .  glucosamine-chondroitin 500-400 MG tablet Take 1 tablet by mouth daily.    . Multiple Vitamin (MULTIVITAMIN) capsule Take 1 capsule by mouth daily.    . rivaroxaban (XARELTO) 20 MG TABS tablet Take 1 tablet (20 mg total) by mouth daily with supper. 30 tablet 3  . timolol (TIMOPTIC) 0.5 % ophthalmic solution Place 1 drop into both eyes 2 (two) times daily.  24  . diltiazem (CARDIZEM) 30 MG tablet Take 1 tablet (30 mg total) by mouth once for 1 dose. Take with Flecainide (Patient not taking: Reported on 05/23/2018) 4 tablet 0  . flecainide (TAMBOCOR) 100 MG tablet Take 3 tablets (300 mg total) by mouth once for 1 dose. As needed for tachycardia. Do not take more than once per week. (Patient not taking: Reported on 05/23/2018) 12 tablet 0   No current facility-administered medications for this visit.     Family History  Problem Relation Age of Onset  . Hypertension Father   . Cancer Father        PROSTATE  . Thyroid disease Mother   . Alzheimer's disease Mother   . Breast cancer Sister 42       mental retardation  . Breast cancer Paternal Grandmother   . Cancer Maternal Grandmother        unknown type  .  Breast cancer Maternal Grandmother   . Breast cancer Other        aunt  . Cancer Other        aunt, unknown type-had hysterectomy  . Thyroid disease Daughter        cyst on thyroid-removed partial thyroid  . Other Maternal Grandfather        TRAIN WRECK  . Colon cancer Neg Hx     Review of Systems  Constitutional:       Weigh gain   Genitourinary: Positive for dyspareunia and urgency.       Loss of sexual interest  [place] os urine with sneeze or cough   Musculoskeletal: Positive for myalgias.  All other systems reviewed and are negative.   Exam:   BP 130/60 (BP Location: Right Arm, Patient Position: Sitting, Cuff Size: Large)   Pulse 68   Resp 14   Ht 5' 8.25" (1.734 m)   Wt 158 lb (71.7 kg)   LMP 08/17/1998   BMI 23.85 kg/m   Height:   Height: 5' 8.25" (173.4 cm)  Ht  Readings from Last 3 Encounters:  05/23/18 5' 8.25" (1.734 m)  04/21/18 5' 8.5" (1.74 m)  02/10/18 5' 8.5" (1.74 m)    General appearance: alert, cooperative and appears stated age Head: Normocephalic, without obvious abnormality, atraumatic Neck: no adenopathy, supple, symmetrical, trachea midline and thyroid normal to inspection and palpation Lungs: clear to auscultation bilaterally Breasts: normal appearance, no masses or tenderness, on the left; right breast with well healed incision with scarring and retraction inferiorly--stable.  Compared to picture from 2018.  This is stable. Heart: regular rate and rhythm Abdomen: soft, non-tender; bowel sounds normal; no masses,  no organomegaly Extremities: extremities normal, atraumatic, no cyanosis or edema Skin: Skin color, texture, turgor normal. No rashes or lesions Lymph nodes: Cervical, supraclavicular, and axillary nodes normal. No abnormal inguinal nodes palpated Neurologic: Grossly normal   Pelvic: External genitalia:  no lesions              Urethra:  normal appearing urethra with no masses, tenderness or lesions              Bartholins and Skenes: normal                 Vagina: atrophic changes with normal color and discharge, no lesions              Cervix: no lesions              Pap taken: No. Bimanual Exam:  Uterus:  normal size, contour, position, consistency, mobility, non-tender              Adnexa: normal adnexa and no mass, fullness, tenderness               Rectovaginal: Confirms               Anus:  normal sphincter tone, no lesions  Chaperone was present for exam.  A:  Well Woman with normal exam PMP, no HRT H/o breast cancer 4/09, release from oncology in 2015 Family hx of breast cancer in sister and PMG A flutter and afib, now on anticoagulation, s/p cardiac ablation  P:   Mammogram guidelines reviewed.  Doing 3D MMG. pap smear neg 2018 Lab work done with Dr. Dema Severin in March Information about Shigrix  vaccination Trial of vaginal Vit E suppositories to pharmacy.  One PV two to three times weekly.  #36/4RF. return annually or prn

## 2018-05-23 NOTE — Patient Instructions (Signed)
[facility] Phone: [phone] ([phone] Location: Lower level of [facility] ([address]) Hours: 7:30 a.m. to 6:00 p.m., Monday through Friday.   [facility] Phone: [phone] Location: [address] Hours: 7:30 a.m. to 6:00 p.m., Monday through Friday.  

## 2018-05-25 ENCOUNTER — Encounter: Payer: Self-pay | Admitting: Internal Medicine

## 2018-05-25 ENCOUNTER — Ambulatory Visit: Payer: Medicare Other | Admitting: Internal Medicine

## 2018-05-25 VITALS — BP 130/78 | HR 56 | Ht 68.0 in | Wt 156.6 lb

## 2018-05-25 DIAGNOSIS — I483 Typical atrial flutter: Secondary | ICD-10-CM | POA: Diagnosis not present

## 2018-05-25 DIAGNOSIS — I480 Paroxysmal atrial fibrillation: Secondary | ICD-10-CM

## 2018-05-25 DIAGNOSIS — R002 Palpitations: Secondary | ICD-10-CM

## 2018-05-25 MED ORDER — FLECAINIDE ACETATE 100 MG PO TABS: 300.0000 mg | ORAL_TABLET | Freq: Every day | ORAL | 0 refills | Status: DC | PRN

## 2018-05-25 MED ORDER — DILTIAZEM HCL 30 MG PO TABS: 30.0000 mg | ORAL_TABLET | ORAL | 1 refills | Status: DC | PRN

## 2018-05-25 MED FILL — SHINGRIX 50 MCG SUS: 50 | 1 days supply | Qty: 1 | Fill #0

## 2018-05-25 NOTE — Progress Notes (Signed)
PCP: Harlan Stains, MD   Primary EP: Dr Lynwood Dawley Cathy Montoya is a 72 y.o. female who presents today for routine electrophysiology followup.  Since her recent atrial flutter ablation, the patient reports doing very well.  She had persistent afib post ablation for atrial flutter.  She denies symptoms of arrhythmia.  Denies procedure related complications. Today, she denies symptoms of palpitations, chest pain, shortness of breath,  lower extremity edema, dizziness, presyncope, or syncope.  The patient is otherwise without complaint today.   Past Medical History:  Diagnosis Date  . Abnormal Pap smear   . Acne rosacea   . Alopecia    frontal  . Arthritis    knees, lt  hip, lower back, hands, shoulders  . Breast cancer, right ([place]) 04/11/2004   invasive lobular carcinoma treated with 1 chemo treatment and radiation  . Cat allergies   . Intermittent vertigo    FRONTAL FIBROSIS ALOPECIA  . Lipoma   . Shoulder pain, left   . Typical atrial flutter [facility])    Past Surgical History:  Procedure Laterality Date  . A-FLUTTER ABLATION N/A 04/21/2018   Procedure: A-FLUTTER ABLATION;  Surgeon: Thompson Grayer, MD;  Location: [place] [facility];  Service: Cardiovascular;  Laterality: N/A;  . BREAST LUMPECTOMY  01/10/2004   lymph node, chemo x 1/radiation  . COLONOSCOPY    . GYNECOLOGIC CRYOSURGERY  1988   cervical dysplasia    ROS- all systems are reviewed and negatives except as per HPI above  Current Outpatient Medications  Medication Sig Dispense Refill  . glucosamine-chondroitin 500-400 MG tablet Take 1 tablet by mouth daily.    . Multiple Vitamin (MULTIVITAMIN) capsule Take 1 capsule by mouth daily.    . NONFORMULARY OR COMPOUNDED ITEM Vitamin E vaginal suppository 200mg /ml.  One pv three times weekly. 36 each 4  . rivaroxaban (XARELTO) 20 MG TABS tablet Take 1 tablet (20 mg total) by mouth daily with supper. 30 tablet 3  . timolol (TIMOPTIC) 0.5 % ophthalmic solution Place 1 drop into  both eyes 2 (two) times daily.  24  . diltiazem (CARDIZEM) 30 MG tablet Take 1 tablet (30 mg total) by mouth once for 1 dose. Take with Flecainide (Patient not taking: Reported on 05/23/2018) 4 tablet 0  . flecainide (TAMBOCOR) 100 MG tablet Take 3 tablets (300 mg total) by mouth once for 1 dose. As needed for tachycardia. Do not take more than once per week. (Patient not taking: Reported on 05/23/2018) 12 tablet 0   No current facility-administered medications for this visit.     Physical Exam: Vitals:   05/25/18 1016  Height: 5\' 8"  (1.727 m)    GEN- The patient is well appearing, alert and oriented x 3 today.   Head- normocephalic, atraumatic Eyes-  Sclera clear, conjunctiva pink Ears- hearing intact Oropharynx- clear Lungs- Clear to ausculation bilaterally, normal work of breathing Heart- Regular rate and rhythm, no murmurs, rubs or gallops, PMI not laterally displaced GI- soft, NT, ND, + BS Extremities- no clubbing, cyanosis, or edema  Wt Readings from Last 3 Encounters:  05/23/18 158 lb (71.7 kg)  04/21/18 157 lb (71.2 kg)  02/10/18 156 lb 6.4 oz (70.9 kg)    EKG tracing ordered today is personally reviewed and shows sinus rhythm 56 bpm, PR 142 msec, QRS 96 msec, QTc 378 msec  Assessment and Plan:  1. Atrial flutter Resolved post ablation  2. afib chads2vasc score is 2 (female and age) She had afib inducible at  EP study.  I am not sure that she is having clinical afib. Per 2019 guideline update, we discussed option of stopping anticoagulation.  She is clear that she wishes to continue anticoagulation at this time Continue prn diltiazem and flecainide I have advised consideration of ILR implant for long term afib monitoring. She declines at this time.  Return to AF clinic in 3 months  Thompson Grayer MD, [facility] 05/25/2018 10:34 AM

## 2018-05-25 NOTE — Patient Instructions (Addendum)
Medication Instructions:  Your physician recommends that you continue on your current medications as directed. Please refer to the Current Medication list given to you today.  You may take diltiazem 1-2 tablets by mouth as needed every 4 hours for high heart rates  You may take flecainide 300 mg once for Afib that does not go away  If you need a refill on your cardiac medications before your next appointment, please call your pharmacy.   Lab work: None Ordered  If you have labs (blood work) drawn today and your tests are completely normal, you will receive your results only by: Marland Kitchen MyChart Message (if you have MyChart) OR . A paper copy in the mail If you have any lab test that is abnormal or we need to change your treatment, we will call you to review the results.  Testing/Procedures: None ordered  Follow-Up: . Your physician recommends that you schedule a follow-up appointment in: 3 months in the Afib Clinic .   Any Other Special Instructions Will Be Listed Below (If Applicable).

## 2018-06-07 ENCOUNTER — Other Ambulatory Visit (HOSPITAL_COMMUNITY): Payer: Self-pay | Admitting: Nurse Practitioner

## 2018-07-19 ENCOUNTER — Emergency Department (HOSPITAL_COMMUNITY)
Admission: EM | Admit: 2018-07-19 | Discharge: 2018-07-19 | Disposition: A | Discharge status: Home / Self Care | Payer: Medicare Other | Attending: Emergency Medicine | Admitting: Emergency Medicine

## 2018-07-19 ENCOUNTER — Encounter (HOSPITAL_COMMUNITY): Payer: Self-pay | Admitting: Emergency Medicine

## 2018-07-19 ENCOUNTER — Other Ambulatory Visit: Payer: Self-pay

## 2018-07-19 DIAGNOSIS — R310 Gross hematuria: Secondary | ICD-10-CM | POA: Insufficient documentation

## 2018-07-19 DIAGNOSIS — Z7901 Long term (current) use of anticoagulants: Secondary | ICD-10-CM | POA: Insufficient documentation

## 2018-07-19 DIAGNOSIS — I483 Typical atrial flutter: Secondary | ICD-10-CM | POA: Diagnosis not present

## 2018-07-19 DIAGNOSIS — Z79899 Other long term (current) drug therapy: Secondary | ICD-10-CM | POA: Insufficient documentation

## 2018-07-19 DIAGNOSIS — Z853 Personal history of malignant neoplasm of breast: Secondary | ICD-10-CM | POA: Insufficient documentation

## 2018-07-19 DIAGNOSIS — R319 Hematuria, unspecified: Secondary | ICD-10-CM | POA: Diagnosis present

## 2018-07-19 NOTE — ED Provider Notes (Signed)
[facility] DEPT Provider Note   CSN: [id number] Arrival date & time: 07/19/18  1941     History   Chief Complaint Chief Complaint  Patient presents with  . Hematuria  . Tachycardia    HPI Cathy Montoya is a 72 y.o. female.  72 yo F with a cc of hematuria.  Started about lunch time.  Has happened 2x prior and resolved spontaneously.  She is on Xarelto for intermittent atrial flutter.  This seems to come and go and usually resolves with her flecainide and diltiazem.  She states that she does not even really notice it anymore.  She denies being lightheaded or dizzy.  She denies abdominal pain or flank pain.  She was seen at a walk-in clinic and was sent here for urgent CT scan.  The history is provided by the patient.  Hematuria  This is a new problem. The current episode started 3 to 5 hours ago. The problem occurs constantly. The problem has not changed since onset.Pertinent negatives include no chest pain, no abdominal pain, no headaches and no shortness of breath. Nothing aggravates the symptoms. Nothing relieves the symptoms. She has tried nothing for the symptoms. The treatment provided no relief.    Past Medical History:  Diagnosis Date  . Abnormal Pap smear   . Acne rosacea   . Alopecia    frontal  . Arthritis    knees, lt  hip, lower back, hands, shoulders  . Breast cancer, right ([place]) 04/11/2004   invasive lobular carcinoma treated with 1 chemo treatment and radiation  . Cat allergies   . Intermittent vertigo    FRONTAL FIBROSIS ALOPECIA  . Lipoma   . Shoulder pain, left   . Typical atrial flutter [facility])     Patient Active Problem List   Diagnosis Date Noted  . Typical atrial flutter ([place])   . Left hip pain   . Breast cancer, right ([place]) 07/27/2012    Past Surgical History:  Procedure Laterality Date  . A-FLUTTER ABLATION N/A 04/21/2018   Procedure: A-FLUTTER ABLATION;  Surgeon: Thompson Grayer, MD;  Location: [facility] [facility]  LAB;  Service: Cardiovascular;  Laterality: N/A;  . BREAST LUMPECTOMY  01/10/2004   lymph node, chemo x 1/radiation  . COLONOSCOPY    . GYNECOLOGIC CRYOSURGERY  1988   cervical dysplasia     OB History    Gravida  2   Para  2   Term      Preterm      AB      Living  2     SAB      TAB      Ectopic      Multiple      Live Births               Home Medications    Prior to Admission medications   Medication Sig Start Date End Date Taking? Authorizing Provider  diltiazem (CARDIZEM) 30 MG tablet Take 1-2 tablets (30-60 mg total) by mouth every 4 (four) hours as needed (High heart rates). 05/25/18   Allred, Jeneen Rinks, MD  flecainide (TAMBOCOR) 100 MG tablet Take 3 tablets (300 mg total) by mouth daily as needed (for Afib that does not go away). 05/25/18   Allred, Jeneen Rinks, MD  glucosamine-chondroitin 500-400 MG tablet Take 1 tablet by mouth daily.    [provider]  Multiple Vitamin (MULTIVITAMIN) capsule Take 1 capsule by mouth daily.    [provider]  NONFORMULARY OR COMPOUNDED ITEM Vitamin E vaginal suppository 200mg /ml.  One pv three times weekly. 05/23/18   Megan Salon, MD  timolol (TIMOPTIC) 0.5 % ophthalmic solution Place 1 drop into both eyes 2 (two) times daily. 12/21/17   [provider]  XARELTO 20 MG TABS tablet TAKE 1 TABLET (20 MG TOTAL) BY MOUTH DAILY WITH SUPPER. 06/07/18   Sherran Needs, NP    Family History Family History  Problem Relation Age of Onset  . Hypertension Father   . Cancer Father        PROSTATE  . Thyroid disease Mother   . Alzheimer's disease Mother   . Breast cancer Sister 73       mental retardation  . Breast cancer Paternal Grandmother   . Cancer Maternal Grandmother        unknown type  . Breast cancer Maternal Grandmother   . Breast cancer Other        aunt  . Cancer Other        aunt, unknown type-had hysterectomy  . Thyroid disease Daughter        cyst on thyroid-removed partial thyroid  .  Other Maternal Grandfather        TRAIN WRECK  . Colon cancer Neg Hx     Social History Social History   Tobacco Use  . Smoking status: Never Smoker  . Smokeless tobacco: Never Used  Substance Use Topics  . Alcohol use: Yes    Comment: wine-occ  . Drug use: No     Allergies   Amoxicillin   Review of Systems Review of Systems  Constitutional: Negative for chills and fever.  HENT: Negative for congestion and rhinorrhea.   Eyes: Negative for redness and visual disturbance.  Respiratory: Negative for shortness of breath and wheezing.   Cardiovascular: Negative for chest pain and palpitations.  Gastrointestinal: Negative for abdominal pain, nausea and vomiting.  Genitourinary: Positive for hematuria. Negative for dysuria, flank pain, frequency and urgency.  Musculoskeletal: Negative for arthralgias and myalgias.  Skin: Negative for pallor and wound.  Neurological: Negative for dizziness and headaches.     Physical Exam Updated Vital Signs BP (!) 171/77 (BP Location: Left Arm)   Pulse (!) 150   Temp 99 F (37.2 C) (Oral)   Resp 18   LMP 08/17/1998   SpO2 100%   Physical Exam  Constitutional: She is oriented to person, place, and time. She appears well-developed and well-nourished. No distress.  HENT:  Head: Normocephalic and atraumatic.  Eyes: Pupils are equal, round, and reactive to light. EOM are normal.  Neck: Normal range of motion. Neck supple.  Cardiovascular: Regular rhythm. Tachycardia present. Exam reveals no gallop and no friction rub.  No murmur heard. Pulmonary/Chest: Effort normal. She has no wheezes. She has no rales.  Abdominal: Soft. She exhibits no distension. There is no tenderness.  Musculoskeletal: She exhibits no edema or tenderness.  Neurological: She is alert and oriented to person, place, and time.  Skin: Skin is warm and dry. She is not diaphoretic.  Psychiatric: She has a normal mood and affect. Her behavior is normal.  Nursing note and  vitals reviewed.    ED Treatments / Results  Labs (all labs ordered are listed, but only abnormal results are displayed) Labs Reviewed - No data to display  EKG EKG Interpretation  Date/Time:  Tuesday July 19 2018 20:22:39 EST Ventricular Rate:  154 PR Interval:    QRS Duration: 148 QT Interval:  312 QTC  Calculation: 487 R Axis:   73 Text Interpretation:  Extreme tachycardia with wide complex, no further rhythm analysis attempted Rate faster Confirmed by Deno Etienne [phone]) on 07/19/2018 8:33:06 PM   Radiology No results found.  Procedures Procedures (including critical care time)  Medications Ordered in ED Medications - No data to display   Initial Impression / Assessment and Plan / ED Course  I have reviewed the triage vital signs and the nursing notes.  Pertinent labs & imaging results that were available during my care of the patient were reviewed by me and considered in my medical decision making (see chart for details).     72 yo F with a chief complaint of hematuria.  Was seen at a walk-in clinic and sent here for CT scan.  I discussed risks and benefits of CT also discussed that typically this is something that I do not routinely imageless the patient is having significant pain concerning for a stone at this point she will hold off on imaging she agrees with urology follow-up.  She is in atrial flutter with rapid ventricular response, I discussed giving her treatment here but she said she will just take 1 of her pills from home.  Return precautions discussed.  8:55 PM:  I have discussed the diagnosis/risks/treatment options with the patient and believe the pt to be eligible for discharge home to follow-up with Urology. We also discussed returning to the ED immediately if new or worsening sx occur. We discussed the sx which are most concerning (e.g., sudden worsening pain, fever, inability to tolerate by mouth, inability to urinate) that necessitate immediate return.  Medications administered to the patient during their visit and any new prescriptions provided to the patient are listed below.  Medications given during this visit Medications - No data to display    The patient appears reasonably screen and/or stabilized for discharge and I doubt any other medical condition or other [facility] requiring further screening, evaluation, or treatment in the ED at this time prior to discharge.    Final Clinical Impressions(s) / ED Diagnoses   Final diagnoses:  Gross hematuria  Typical atrial flutter [facility])    ED Discharge Orders    None       Deno Etienne, DO 07/19/18 2055

## 2018-07-19 NOTE — Discharge Instructions (Signed)
You have blood in your urine, this not uncommonly can cause you to have difficulty peeing if that occurs you need to return to the emergency department.  Also return for lightheadedness dizziness.  Call the number provided to make an appointment as soon as he can with the urologist.

## 2018-07-19 NOTE — ED Triage Notes (Addendum)
Patient reports hematuria today. Sent from [place] walk in clinic. States recommended imaging. Reports urine showed no infection but large amount of blood.  Patient tachycardic in triage. HR 150. Hx afib. Reports taking xarelto as prescribed and diltiazen PRN. Denies chest pain and SOB.

## 2018-07-27 MED FILL — SHINGRIX 50 MCG SUS: 50 | 1 days supply | Qty: 1 | Fill #1

## 2018-08-29 ENCOUNTER — Encounter (HOSPITAL_COMMUNITY): Payer: Self-pay | Admitting: Nurse Practitioner

## 2018-08-29 ENCOUNTER — Ambulatory Visit (HOSPITAL_COMMUNITY)
Admission: RE | Admit: 2018-08-29 | Discharge: 2018-08-29 | Disposition: A | Discharge status: Home / Self Care | Payer: Medicare Other | Source: Ambulatory Visit | Attending: Nurse Practitioner | Admitting: Nurse Practitioner

## 2018-08-29 VITALS — BP 126/58 | HR 66 | Ht 68.0 in | Wt 157.0 lb

## 2018-08-29 DIAGNOSIS — Z8249 Family history of ischemic heart disease and other diseases of the circulatory system: Secondary | ICD-10-CM | POA: Insufficient documentation

## 2018-08-29 DIAGNOSIS — Z792 Long term (current) use of antibiotics: Secondary | ICD-10-CM | POA: Diagnosis not present

## 2018-08-29 DIAGNOSIS — R9431 Abnormal electrocardiogram [ECG] [EKG]: Secondary | ICD-10-CM | POA: Diagnosis not present

## 2018-08-29 DIAGNOSIS — I480 Paroxysmal atrial fibrillation: Secondary | ICD-10-CM

## 2018-08-29 DIAGNOSIS — I4892 Unspecified atrial flutter: Secondary | ICD-10-CM | POA: Insufficient documentation

## 2018-08-29 DIAGNOSIS — Z79899 Other long term (current) drug therapy: Secondary | ICD-10-CM | POA: Diagnosis not present

## 2018-08-29 DIAGNOSIS — Z7901 Long term (current) use of anticoagulants: Secondary | ICD-10-CM | POA: Diagnosis not present

## 2018-08-29 DIAGNOSIS — Z880 Allergy status to penicillin: Secondary | ICD-10-CM | POA: Diagnosis not present

## 2018-08-29 MED ORDER — DILTIAZEM HCL 30 MG PO TABS: 30.0000 mg | ORAL_TABLET | ORAL | 2 refills | Status: AC | PRN

## 2018-08-29 NOTE — Progress Notes (Signed)
Primary Care Physician: Harlan Stains, MD Referring Physician: Dr. Lynwood Dawley Cathy Montoya is a 73 y.o. female with a h/o atrial flutter ablation, 04/2018. After the procedure, afib was noted. She has 30 mg Cardizem which she takes for breakthrough afib. She noted more end of November /first of December but has been very quiet for the last 3 weeks. No specific trigger.  Today, she denies symptoms of palpitations, chest pain, shortness of breath, orthopnea, PND, lower extremity edema, dizziness, presyncope, syncope, or neurologic sequela. The patient is tolerating medications without difficulties and is otherwise without complaint today.   Past Medical History:  Diagnosis Date  . Abnormal Pap smear   . Acne rosacea   . Alopecia    frontal  . Arthritis    knees, lt  hip, lower back, hands, shoulders  . Breast cancer, right ([place]) 04/11/2004   invasive lobular carcinoma treated with 1 chemo treatment and radiation  . Cat allergies   . Intermittent vertigo    FRONTAL FIBROSIS ALOPECIA  . Lipoma   . Shoulder pain, left   . Typical atrial flutter [facility])    Past Surgical History:  Procedure Laterality Date  . A-FLUTTER ABLATION N/A 04/21/2018   Procedure: A-FLUTTER ABLATION;  Surgeon: Thompson Grayer, MD;  Location: [facility];  Service: Cardiovascular;  Laterality: N/A;  . BREAST LUMPECTOMY  01/10/2004   lymph node, chemo x 1/radiation  . COLONOSCOPY    . GYNECOLOGIC CRYOSURGERY  1988   cervical dysplasia    Current Outpatient Medications  Medication Sig Dispense Refill  . metroNIDAZOLE (METROCREAM) 0.75 % cream Apply topically 2 (two) times daily.    . tamsulosin (FLOMAX) 0.4 MG CAPS capsule Take 0.4 mg by mouth daily.    . timolol (TIMOPTIC) 0.5 % ophthalmic solution Place 1 drop into both eyes 2 (two) times daily.  24  . XARELTO 20 MG TABS tablet TAKE 1 TABLET (20 MG TOTAL) BY MOUTH DAILY WITH SUPPER. 30 tablet 3  . diltiazem (CARDIZEM) 30 MG tablet Take 1-2 tablets (30-60  mg total) by mouth every 4 (four) hours as needed (High heart rates). 45 tablet 2  . glucosamine-chondroitin 500-400 MG tablet Take 1 tablet by mouth daily.    . Multiple Vitamin (MULTIVITAMIN) capsule Take 1 capsule by mouth daily.    . NONFORMULARY OR COMPOUNDED ITEM Vitamin E vaginal suppository 200mg /ml.  One pv three times weekly. (Patient not taking: Reported on 08/29/2018) 36 each 4   No current facility-administered medications for this encounter.     Allergies  Allergen Reactions  . Amoxicillin Rash    Summer 2019 Rash on Face and stomach    Social History   Socioeconomic History  . Marital status: Married    Spouse name: Not on file  . Number of children: 2  . Years of education: Not on file  . Highest education level: Not on file  Occupational History  . Occupation: Firefighter  Social Needs  . Financial resource strain: Not on file  . Food insecurity:    Worry: Not on file    Inability: Not on file  . Transportation needs:    Medical: Not on file    Non-medical: Not on file  Tobacco Use  . Smoking status: Never Smoker  . Smokeless tobacco: Never Used  Substance and Sexual Activity  . Alcohol use: Yes    Comment: wine-occ  . Drug use: No  . Sexual activity: Yes    Partners: Male  Birth control/protection: Post-menopausal  Lifestyle  . Physical activity:    Days per week: Not on file    Minutes per session: Not on file  . Stress: Not on file  Relationships  . Social connections:    Talks on phone: Not on file    Gets together: Not on file    Attends religious service: Not on file    Active member of club or organization: Not on file    Attends meetings of clubs or organizations: Not on file    Relationship status: Not on file  . Intimate partner violence:    Fear of current or ex partner: Not on file    Emotionally abused: Not on file    Physically abused: Not on file    Forced sexual activity: Not on file  Other Topics Concern  . Not on  file  Social History Narrative  . Not on file    Family History  Problem Relation Age of Onset  . Hypertension Father   . Cancer Father        PROSTATE  . Thyroid disease Mother   . Alzheimer's disease Mother   . Breast cancer Sister 68       mental retardation  . Breast cancer Paternal Grandmother   . Cancer Maternal Grandmother        unknown type  . Breast cancer Maternal Grandmother   . Breast cancer Other        aunt  . Cancer Other        aunt, unknown type-had hysterectomy  . Thyroid disease Daughter        cyst on thyroid-removed partial thyroid  . Other Maternal Grandfather        TRAIN WRECK  . Colon cancer Neg Hx     ROS- All systems are reviewed and negative except as per the HPI above  Physical Exam: Vitals:   08/29/18 1333  BP: (!) 126/58  Pulse: 66  Weight: 71.2 kg  Height: 5\' 8"  (1.727 m)   Wt Readings from Last 3 Encounters:  08/29/18 71.2 kg  05/25/18 71 kg  05/23/18 71.7 kg    Labs: Lab Results  Component Value Date   NA 140 04/15/2018   K 4.6 04/15/2018   CL 104 04/15/2018   CO2 25 04/15/2018   GLUCOSE 95 04/15/2018   BUN 20 04/15/2018   CREATININE 0.93 04/15/2018   CALCIUM 10.3 04/15/2018   No results found for: INR No results found for: CHOL, HDL, LDLCALC, TRIG   GEN- The patient is well appearing, alert and oriented x 3 today.   Head- normocephalic, atraumatic Eyes-  Sclera clear, conjunctiva pink Ears- hearing intact Oropharynx- clear Neck- supple, no JVP Lymph- no cervical lymphadenopathy Lungs- Clear to ausculation bilaterally, normal work of breathing Heart- Regular rate and rhythm, no murmurs, rubs or gallops, PMI not laterally displaced GI- soft, NT, ND, + BS Extremities- no clubbing, cyanosis, or edema MS- no significant deformity or atrophy Skin- no rash or lesion Psych- euthymic mood, full affect Neuro- strength and sensation are intact  EKG- sinus brady at 56 bpm Epic records reviewed    Assessment and  Plan: 1. Aflutter S/p ablation 04/2018 But with afib noted after procedure Currently she is happy to continue with 30 mg Cardizem  2.CHA2DS2VASc of 2 Continue eliquis 5 mg bid per pt preference  F/u here in 6 months sooner if needed  Butch Penny C. Carroll, [facility] [address]  [place], [place] [address] [phone]

## 2018-10-10 ENCOUNTER — Other Ambulatory Visit (HOSPITAL_COMMUNITY): Payer: Self-pay | Admitting: Nurse Practitioner

## 2018-10-21 ENCOUNTER — Ambulatory Visit (HOSPITAL_COMMUNITY)
Admission: RE | Admit: 2018-10-21 | Discharge: 2018-10-21 | Disposition: A | Discharge status: Home / Self Care | Payer: Medicare Other | Attending: Urology | Admitting: Urology

## 2018-10-21 ENCOUNTER — Other Ambulatory Visit: Payer: Self-pay | Admitting: Urology

## 2018-10-21 ENCOUNTER — Encounter (HOSPITAL_COMMUNITY)
Admission: RE | Disposition: A | Discharge status: Home / Self Care | Payer: Self-pay | Source: Home / Self Care | Attending: Urology

## 2018-10-21 ENCOUNTER — Ambulatory Visit (HOSPITAL_COMMUNITY): Payer: Medicare Other | Admitting: Anesthesiology

## 2018-10-21 ENCOUNTER — Other Ambulatory Visit: Payer: Self-pay

## 2018-10-21 ENCOUNTER — Ambulatory Visit (HOSPITAL_COMMUNITY): Payer: Medicare Other

## 2018-10-21 ENCOUNTER — Encounter (HOSPITAL_COMMUNITY): Payer: Self-pay | Admitting: *Deleted

## 2018-10-21 DIAGNOSIS — Z803 Family history of malignant neoplasm of breast: Secondary | ICD-10-CM | POA: Diagnosis not present

## 2018-10-21 DIAGNOSIS — Z8042 Family history of malignant neoplasm of prostate: Secondary | ICD-10-CM | POA: Insufficient documentation

## 2018-10-21 DIAGNOSIS — Z853 Personal history of malignant neoplasm of breast: Secondary | ICD-10-CM | POA: Insufficient documentation

## 2018-10-21 DIAGNOSIS — Z79899 Other long term (current) drug therapy: Secondary | ICD-10-CM | POA: Insufficient documentation

## 2018-10-21 DIAGNOSIS — I4891 Unspecified atrial fibrillation: Secondary | ICD-10-CM | POA: Diagnosis not present

## 2018-10-21 DIAGNOSIS — N201 Calculus of ureter: Secondary | ICD-10-CM | POA: Diagnosis present

## 2018-10-21 DIAGNOSIS — Z880 Allergy status to penicillin: Secondary | ICD-10-CM | POA: Insufficient documentation

## 2018-10-21 DIAGNOSIS — M1990 Unspecified osteoarthritis, unspecified site: Secondary | ICD-10-CM | POA: Diagnosis not present

## 2018-10-21 DIAGNOSIS — Z7901 Long term (current) use of anticoagulants: Secondary | ICD-10-CM | POA: Diagnosis not present

## 2018-10-21 DIAGNOSIS — I351 Nonrheumatic aortic (valve) insufficiency: Secondary | ICD-10-CM | POA: Diagnosis not present

## 2018-10-21 HISTORY — PX: CYSTOSCOPY/URETEROSCOPY/HOLMIUM LASER/STENT PLACEMENT: SHX6546

## 2018-10-21 HISTORY — DX: Cardiac arrhythmia, unspecified: I49.9

## 2018-10-21 HISTORY — DX: Personal history of urinary calculi: Z87.442

## 2018-10-21 LAB — BASIC METABOLIC PANEL
Anion gap: 9 (ref 5–15)
BUN: 23 mg/dL (ref 8–23)
CO2: 25 mmol/L (ref 22–32)
Calcium: 9.6 mg/dL (ref 8.9–10.3)
Chloride: 105 mmol/L (ref 98–111)
Creatinine, Ser: 0.84 mg/dL (ref 0.44–1.00)
GFR calc Af Amer: >60 mL/min (ref >60)
GFR calc non Af Amer: >60 mL/min (ref >60)
Glucose, Bld: 89 mg/dL (ref 70–99)
Potassium: 4.1 mmol/L (ref 3.5–5.1)
SODIUM: 139 mmol/L (ref 135–145)

## 2018-10-21 LAB — CBC
HCT: 45.3 % (ref 36.0–46.0)
Hemoglobin: 13.8 g/dL (ref 12.0–15.0)
MCH: 29.4 pg (ref 26.0–34.0)
MCHC: 30.5 g/dL (ref 30.0–36.0)
MCV: 96.6 fL (ref 80.0–100.0)
Platelets: 182 10*3/uL (ref 150–400)
RBC: 4.69 MIL/uL (ref 3.87–5.11)
RDW: 13.4 % (ref 11.5–15.5)
WBC: 6.8 10*3/uL (ref 4.0–10.5)
nRBC: 0 % (ref 0.0–0.2)

## 2018-10-21 SURGERY — CYSTOSCOPY/URETEROSCOPY/HOLMIUM LASER/STENT PLACEMENT
Anesthesia: General | Site: Ureter | Laterality: Bilateral

## 2018-10-21 MED ORDER — ALBUMIN HUMAN 5 % IV SOLN
INTRAVENOUS | Status: AC
  Filled 2018-10-21: qty 250

## 2018-10-21 MED ORDER — DEXAMETHASONE SODIUM PHOSPHATE 10 MG/ML IJ SOLN
INTRAMUSCULAR | Status: AC
  Filled 2018-10-21: qty 1

## 2018-10-21 MED ORDER — HYDROCODONE-ACETAMINOPHEN 5-325 MG PO TABS: 1.0000 | ORAL_TABLET | ORAL | 0 refills | Status: DC | PRN

## 2018-10-21 MED ORDER — ONDANSETRON HCL 4 MG/2ML IJ SOLN
INTRAMUSCULAR | Status: DC | PRN
  Administered 2018-10-21: 4 mg via INTRAVENOUS

## 2018-10-21 MED ORDER — LACTATED RINGERS IV SOLN
INTRAVENOUS | Status: DC
  Administered 2018-10-21: 15:00:00 via INTRAVENOUS

## 2018-10-21 MED ORDER — MIDAZOLAM HCL 2 MG/2ML IJ SOLN
INTRAMUSCULAR | Status: DC | PRN
  Administered 2018-10-21: 1 mg via INTRAVENOUS

## 2018-10-21 MED ORDER — PROMETHAZINE HCL 25 MG/ML IJ SOLN: 6.2500 mg | INTRAMUSCULAR | Status: DC | PRN

## 2018-10-21 MED ORDER — CIPROFLOXACIN IN D5W 400 MG/200ML IV SOLN
400.0000 mg | INTRAVENOUS | Status: AC
  Administered 2018-10-21: 400 mg via INTRAVENOUS
  Filled 2018-10-21: qty 200

## 2018-10-21 MED ORDER — ACETAMINOPHEN 500 MG PO TABS
1000.0000 mg | ORAL_TABLET | Freq: Once | ORAL | Status: AC
  Administered 2018-10-21: 1000 mg via ORAL
  Filled 2018-10-21: qty 2

## 2018-10-21 MED ORDER — DEXAMETHASONE SODIUM PHOSPHATE 10 MG/ML IJ SOLN
INTRAMUSCULAR | Status: DC | PRN
  Administered 2018-10-21: 4 mg via INTRAVENOUS

## 2018-10-21 MED ORDER — SODIUM CHLORIDE 0.9 % IR SOLN
Status: DC | PRN
  Administered 2018-10-21: 3000 mL

## 2018-10-21 MED ORDER — PROPOFOL 10 MG/ML IV BOLUS
INTRAVENOUS | Status: AC
  Filled 2018-10-21: qty 20

## 2018-10-21 MED ORDER — PROPOFOL 10 MG/ML IV BOLUS
INTRAVENOUS | Status: DC | PRN
  Administered 2018-10-21: 150 mg via INTRAVENOUS

## 2018-10-21 MED ORDER — ONDANSETRON HCL 4 MG/2ML IJ SOLN
INTRAMUSCULAR | Status: AC
  Filled 2018-10-21: qty 2

## 2018-10-21 MED ORDER — LIDOCAINE 2% (20 MG/ML) 5 ML SYRINGE
INTRAMUSCULAR | Status: DC | PRN
  Administered 2018-10-21: 60 mg via INTRAVENOUS

## 2018-10-21 MED ORDER — MIDAZOLAM HCL 2 MG/2ML IJ SOLN
INTRAMUSCULAR | Status: AC
  Filled 2018-10-21: qty 2

## 2018-10-21 MED ORDER — FENTANYL CITRATE (PF) 100 MCG/2ML IJ SOLN
INTRAMUSCULAR | Status: AC
  Filled 2018-10-21: qty 2

## 2018-10-21 MED ORDER — FENTANYL CITRATE (PF) 250 MCG/5ML IJ SOLN
INTRAMUSCULAR | Status: DC | PRN
  Administered 2018-10-21: 25 ug via INTRAVENOUS

## 2018-10-21 MED ORDER — ALBUMIN HUMAN 5 % IV SOLN
INTRAVENOUS | Status: DC | PRN
  Administered 2018-10-21: 20:00:00 via INTRAVENOUS

## 2018-10-21 MED ORDER — LIDOCAINE 2% (20 MG/ML) 5 ML SYRINGE
INTRAMUSCULAR | Status: AC
  Filled 2018-10-21: qty 5

## 2018-10-21 MED ORDER — IOHEXOL 300 MG/ML  SOLN
INTRAMUSCULAR | Status: DC | PRN
  Administered 2018-10-21: 10 mL via URETHRAL

## 2018-10-21 MED ORDER — SCOPOLAMINE 1 MG/3DAYS TD PT72
1.0000 | MEDICATED_PATCH | TRANSDERMAL | Status: DC
  Administered 2018-10-21: 1.5 mg via TRANSDERMAL
  Filled 2018-10-21: qty 1

## 2018-10-21 SURGICAL SUPPLY — 26 items
BAG URO CATCHER STRL LF (MISCELLANEOUS) ×2 IMPLANT
BASKET LASER NITINOL 1.9FR (BASKET) IMPLANT
BASKET ZERO TIP NITINOL 2.4FR (BASKET) ×1 IMPLANT
BSKT STON RTRVL 120 1.9FR (BASKET)
BSKT STON RTRVL ZERO TP 2.4FR (BASKET) ×1
CATH INTERMIT  6FR 70CM (CATHETERS) ×1 IMPLANT
CATH URET 5FR 28IN CONE TIP (BALLOONS)
CATH URET 5FR 70CM CONE TIP (BALLOONS) IMPLANT
CLOTH BEACON ORANGE TIMEOUT ST (SAFETY) ×2 IMPLANT
COVER WAND RF STERILE (DRAPES) IMPLANT
EXTRACTOR STONE 1.7FRX115CM (UROLOGICAL SUPPLIES) IMPLANT
FIBER LASER FLEXIVA 365 (UROLOGICAL SUPPLIES) IMPLANT
FIBER LASER TRAC TIP (UROLOGICAL SUPPLIES) IMPLANT
GLOVE BIO SURGEON STRL SZ7.5 (GLOVE) ×3 IMPLANT
GLOVE BIOGEL PI IND STRL 7.0 (GLOVE) IMPLANT
GLOVE BIOGEL PI INDICATOR 7.0 (GLOVE) ×1
GOWN STRL REUS W/TWL XL LVL3 (GOWN DISPOSABLE) ×4 IMPLANT
GUIDEWIRE ANG ZIPWIRE 038X150 (WIRE) IMPLANT
GUIDEWIRE STR DUAL SENSOR (WIRE) ×3 IMPLANT
KIT TURNOVER KIT A (KITS) IMPLANT
MANIFOLD NEPTUNE II (INSTRUMENTS) ×2 IMPLANT
PACK CYSTO (CUSTOM PROCEDURE TRAY) ×2 IMPLANT
SHEATH URETERAL 12FRX28CM (UROLOGICAL SUPPLIES) IMPLANT
SHEATH URETERAL 12FRX35CM (MISCELLANEOUS) IMPLANT
STENT CONTOUR 6FRX24X.038 (STENTS) ×1 IMPLANT
TUBING UROLOGY SET (TUBING) ×2 IMPLANT

## 2018-10-21 NOTE — H&P (Signed)
CC: I have kidney stones.  HPI: Cathy Montoya is a 72 year-old female established patient who is here for renal calculi.  08/11/2018: She presented to see Dr Matilde Sprang with gross heamturia and was diagnosed with a 46mm distal right ureteral calculus. KUB today shows a persistent right ureteral calculus.   09/02/2018: She has not passed her stone. No flank pain. KUB show persistent right distal ureteral calculus   09/20/18: She returns today for follow up. She denies seeing a stone pass, but has not been straining. She denies any current flank or abdominal pain. She denies exacerbation in voiding symptoms, gross hematuria, fevers, or chills. No nausea or vomiting.   10/04/18: She returns today for follow up. She denies seeing a stone pass in the interim. She denies any current flank or abdominal pain. She denies exacerbation in lower urinary tract symptoms, hematuria, fevers, nausea, or vomiting.   10/21/18: She presents today after repeat CT imaging this morning. CT imaging revealed persistent 4 mm right distal ureteral calculus. There is now a new approximatly 3 mm left distal ureteral calculus. There is no hydronephrosis noted. Radiology report only noted the 3 mm left distal stone. However, imaging study reviewed with Dr. Alyson Ingles and Dr. Gloriann Loan. I called and discussed this result with the patient and recommended that she follow up today for likely intervention. She denies any current flank or abdominal pain. She denies difficulties voiding or exacerbation of voiding symptoms. Urine output is normal. No dysuria, gross hematuria, fevers, or chills. She does have A. Fib and is current on Xarelto. No other cardiac history. No problems with general anesthesia in the past.   The problem is on the right side. She first stated noticing pain on 07/26/2018. This is her first kidney stone. She is not currently having flank pain, back pain, groin pain, nausea, vomiting, fever or chills. She has not caught a stone in  her urine strainer since her symptoms began.   She has never had surgical treatment for calculi in the past.     ALLERGIES: Amoxicillin - Skin Rash    MEDICATIONS: Diltiazem 12Hr Er  Glucosamine  Multivitamin  Xarelto     GU PSH: Cystoscopy - 07/26/2018 Locm 300-399Mg /Ml Iodine,1Ml - 07/26/2018    NON-GU PSH: Breast Surgery Procedure - 2005 Heart Surgery (Unspecified) - 04/21/2018    GU PMH: Ureteral calculus - 10/04/2018, - 09/20/2018, - 09/02/2018, - 08/11/2018 Gross hematuria - 07/20/2018 Mixed incontinence - 07/20/2018 Nocturia - 07/20/2018 Urinary Frequency - 07/20/2018    NON-GU PMH: Atrial Fibrillation Breast Cancer, History    FAMILY HISTORY: 2 daughters - Other Breast Cancer - Grandmother, Aunt, Sister father deceased - Other mother deceased - Other Prostate Cancer - Grandfather    Notes: Mother died at 70, Father died at 16   SOCIAL HISTORY: Marital Status: Married Current Smoking Status: Patient has never smoked.   Tobacco Use Assessment Completed: Used Tobacco in last 30 days? Social Drinker.  Drinks 1 caffeinated drink per day. 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    REVIEW OF SYSTEMS:    GU Review Female:   Patient denies frequent urination, hard to postpone urination, burning /pain with urination, get up at night to urinate, leakage of urine, stream starts and stops, trouble starting your stream, have to strain to urinate, and being pregnant.  Gastrointestinal (Upper):   Patient denies nausea, vomiting, and indigestion/ heartburn.  Gastrointestinal (Lower):   Patient denies diarrhea and constipation.  Constitutional:   Patient denies fever, night sweats, weight loss,  and fatigue.  Skin:   Patient denies skin rash/ lesion and itching.  Eyes:   Patient denies blurred vision and double vision.  Ears/ Nose/ Throat:   Patient denies sore throat and sinus problems.  Hematologic/Lymphatic:   Patient denies swollen glands and easy bruising.   Cardiovascular:   Patient denies leg swelling and chest pains.  Respiratory:   Patient denies cough and shortness of breath.  Endocrine:   Patient denies excessive thirst.  Musculoskeletal:   Patient denies back pain and joint pain.  Neurological:   Patient denies headaches and dizziness.  Psychologic:   Patient denies depression and anxiety.   VITAL SIGNS:      10/21/2018 01:09 PM  Weight 158 lb / 71.67 kg  BP 159/66 mmHg  Pulse 71 /min  Temperature 98.3 F / 36.8 C   MULTI-SYSTEM PHYSICAL EXAMINATION:    Constitutional: Well-nourished. No physical deformities. Normally developed. Good grooming.  Respiratory: Normal breath sounds. No labored breathing, no use of accessory muscles.   Cardiovascular: Regular rate and rhythm. No murmur, no gallop. Normal temperature, normal extremity pulses, no swelling, no varicosities.   Neurologic / Psychiatric: Oriented to time, oriented to place, oriented to person. No depression, no anxiety, no agitation.  Gastrointestinal: No mass, no tenderness, no rigidity, non obese abdomen. No CVAT.   Musculoskeletal: Normal gait and station of head and neck.     PAST DATA REVIEWED:  Source Of History:  Patient  Records Review:   Previous Patient Records  Urine Test Review:   Urinalysis, Urine Culture  X-Ray Review: C.T. Abdomen/Pelvis: Reviewed Films. Reviewed Report.     PROCEDURES:          Urinalysis w/Scope Dipstick Dipstick Cont'd Micro  Color: Yellow Bilirubin: Neg mg/dL WBC/hpf: 10 - 20/hpf  Appearance: Cloudy Ketones: Neg mg/dL RBC/hpf: 3 - 10/hpf  Specific Gravity: 1.020 Blood: Neg ery/uL Bacteria: Rare (0-9/hpf)  pH: <=5.0 Protein: Neg mg/dL Cystals: NS (Not Seen)  Glucose: Neg mg/dL Urobilinogen: 0.2 mg/dL Casts: NS (Not Seen)    Nitrites: Neg Trichomonas: Not Present    Leukocyte Esterase: 2+ leu/uL Mucous: Not Present      Epithelial Cells: 0 - 5/hpf      Yeast: NS (Not Seen)      Sperm: Not Present    ASSESSMENT:      ICD-10  Details  1 GU:   Ureteral calculus - N20.1    PLAN:           Orders Labs Urine Culture          Document Letter(s):  Created for Patient: Clinical Summary         Notes:   Jethro Bolus today is not overtly concerning for infectious process, but I will send urine for culture. We again discussed CT imaging findings and proceeding with cystoscopy, bilateral retrograde pyelograms, bilateral URS, and bilateral ureteral stent placement. We reviewed indications and potential risks.   - For ureteroscopy I described the risks  which include general anesthetic complications, bleeding,  infection, damage to contiguous structures, positioning injury, ureteral  stricture, ureteral avulsion, ureteral injury, need for ureteral stent,  inability to perform ureteroscopy, need for an interval procedure, inability to  clear stone burden, stent discomfort and pain.   Case reviewed with Dr. Alyson Ingles and Dr. Gloriann Loan. She has been NPO since 10:15 this morning. She wil proceed with [facility] for her procedure.         Next Appointment:      Next Appointment: 10/28/2018 10:30  AM    Appointment Type: Postoperative Appointment    Location: [facility] [phone]    Provider: Link Snuffer, III, M.D.    Reason for Visit: POST OP     Signed by Azucena Fallen on 10/21/18 at 1:45 PM (EST

## 2018-10-21 NOTE — Transfer of Care (Signed)
Immediate Anesthesia Transfer of Care Note  Patient: Cathy Montoya  Procedure(s) Performed: CYSTOSCOPY/BILATERAL RETROGRADE PYELOGRAM / BILATERAL URETEROSCOPY/ BILATERAL STENT PLACEMENT (Bilateral Ureter)  Patient Location: PACU  Anesthesia Type:General  Level of Consciousness: awake, alert  and oriented  Airway & Oxygen Therapy: Patient Spontanous Breathing and Patient connected to face mask oxygen  Post-op Assessment: Report given to RN and Post -op Vital signs reviewed and stable  Post vital signs: Reviewed and stable  Last Vitals:  Vitals Value Taken Time  BP    Temp    Pulse 56 10/21/2018  8:20 PM  Resp 11 10/21/2018  8:20 PM  SpO2 100 % 10/21/2018  8:20 PM  Vitals shown include unvalidated device data.  Last Pain:  Vitals:   10/21/18 1510  TempSrc:   PainSc: 0-No pain         Complications: No apparent anesthesia complications

## 2018-10-21 NOTE — Anesthesia Postprocedure Evaluation (Signed)
Anesthesia Post Note  Patient: Cathy Montoya  Procedure(s) Performed: CYSTOSCOPY/BILATERAL RETROGRADE PYELOGRAM / BILATERAL URETEROSCOPY/ BILATERAL STENT PLACEMENT (Bilateral Ureter)     Patient location during evaluation: PACU Anesthesia Type: General Level of consciousness: sedated Pain management: pain level controlled Vital Signs Assessment: post-procedure vital signs reviewed and stable Respiratory status: spontaneous breathing and respiratory function stable Cardiovascular status: stable Postop Assessment: no apparent nausea or vomiting Anesthetic complications: no    Last Vitals:  Vitals:   10/21/18 2030 10/21/18 2045  BP: (!) 156/81   Pulse: (!) 55   Resp: 12   Temp:  36.6 C  SpO2: 100%     Last Pain:  Vitals:   10/21/18 2030  TempSrc:   PainSc: 0-No pain                 Matalie Romberger DANIEL

## 2018-10-21 NOTE — Anesthesia Procedure Notes (Signed)
Procedure Name: LMA Insertion Date/Time: 10/21/2018 7:19 PM Performed by: Niel Hummer, CRNA Pre-anesthesia Checklist: Patient identified, Emergency Drugs available, Suction available and Patient being monitored Patient Re-evaluated:Patient Re-evaluated prior to induction Oxygen Delivery Method: Circle system utilized Preoxygenation: Pre-oxygenation with 100% oxygen Induction Type: IV induction Ventilation: Mask ventilation without difficulty LMA: LMA with gastric port inserted LMA Size: 4.0 Dental Injury: Teeth and Oropharynx as per pre-operative assessment

## 2018-10-21 NOTE — Op Note (Signed)
Operative Note  Preoperative diagnosis:  1.  Bilateral ureteral calculi  Postoperative diagnosis: 1.  Bilateral ureteral calculi  Procedure(s): 1.  Cystoscopy with bilateral retrograde pyelogram, bilateral ureteroscopy with stone extraction and ureteral stent placement  Surgeon: Link Snuffer, MD  Assistants: Basilio Cairo, MD, resident  Anesthesia: General  Complications: None immediate  EBL: Minimal  Specimens: 1.  None  Drains/Catheters: 1.  Bilateral 6 x 24 double-J ureteral stents  Intraoperative findings: Normal urethra and bladder.  Left ureteroscopy revealed multiple small distal ureteral calculi that were atraumatically basket extracted.  Retrograde pyelogram revealed no filling defects and evidence of horseshoe kidney.  Right ureteroscopy revealed multiple small soft distal ureteral calculi also atraumatically basket extracted and retrograde pyelogram revealed no filling defects or hydronephrosis.  Indication: 73 year old female found to have bilateral distal ureteral calculi presents for the previously mentioned operation.  Description of procedure:  The patient was identified and consent was obtained.  The patient was taken to the operating room and placed in the supine position.  The patient was placed under general anesthesia.  Perioperative antibiotics were administered.  The patient was placed in dorsal lithotomy.  Patient was prepped and draped in a standard sterile fashion and a timeout was performed.  A 21 French rigid cystoscope was advanced into the urethra and into the bladder.  Complete cystoscopy was performed with no abnormal findings.  The left ureter was cannulated with a sensor wire which was advanced up to the kidney under fluoroscopic guidance.  A semirigid ureteroscope was advanced alongside the wire up to the stone of interest which was atraumatically basket extracted.  A semirigid ureteroscope was then advanced up to the renal pelvis and no other  calculi were seen.  Retrograde pyelogram was performed through the scope with the findings noted above.  The scope was withdrawn.  The wire was backloaded onto a rigid cystoscope which was advanced into the urethra and into the bladder.  A 6 x 24 double-J ureteral stent was placed in a standard fashion followed by removal of the wire.  Fluoroscopy confirmed proximal placement and direct visualization confirmed a good coil within the bladder.  A sensor wire was then advanced up the right ureter and into the kidney under fluoroscopic guidance.  A semirigid ureteroscope was advanced alongside the wire up to the stone of interest which was atraumatically basket extracted.  During extraction, the stone broke into multiple pieces but all these pieces were basket extracted.  The scope was then advanced to the renal pelvis and no other ureteral calculi were seen.  Retrograde pyelogram was performed through the scope.  The findings are noted above.  The scope was then withdrawn.  The wire was then backloaded onto a rigid cystoscope which was advanced into the bladder and a 6 x 24 double-J ureteral stent was placed in a standard fashion followed by removal of the wire.  Fluoroscopy confirmed proximal placement and direct visualization confirmed a good coil within the bladder.  The bladder was drained and the scope withdrawn.  The patient tolerated the procedure well and was stable postoperatively.  Plan: Follow-up in 1 week for ureteral stent removal.

## 2018-10-21 NOTE — Discharge Instructions (Addendum)
[facility] °[phone] °Post Ureteroscopy With or Without Stent Instructions ° °Definitions: ° °Ureter: The duct that transports urine from the kidney to the bladder. °Stent:   A plastic hollow tube that is placed into the ureter, from the kidney to the                 bladder to prevent the ureter from swelling shut. ° °GENERAL INSTRUCTIONS: ° °Despite the fact that no skin incisions were used, the area around the ureter and bladder is raw and irritated. The stent is a foreign body which will further irritate the bladder wall. This irritation is manifested by increased frequency of urination, both day and night, and by an increase in the urge to urinate. In some, the urge to urinate is present almost always. Sometimes the urge is strong enough that you may not be able to stop yourself from urinating. The only real cure is to remove the stent and then give time for the bladder wall to heal which can't be done until the danger of the ureter swelling shut has passed, which varies. ° °You may see some blood in your urine while the stent is in place and a few days afterwards. Do not be alarmed, even if the urine was clear for a while. Get off your feet and drink lots of fluids until clearing occurs. If you start to pass clots or don't improve, call us. ° °DIET: °You may return to your normal diet immediately. Because of the raw surface of your bladder, alcohol, spicy foods, acid type foods and drinks with caffeine may cause irritation or frequency and should be used in moderation. To keep your urine flowing freely and to avoid constipation, drink plenty of fluids during the day ( 8-10 glasses ). °Tip: Avoid cranberry juice because it is very acidic. ° °ACTIVITY: °Your physical activity doesn't need to be restricted. However, if you are very active, you may see some blood in your urine. We suggest that you reduce your activity under these circumstances until the bleeding has stopped. ° °BOWELS: °It is  important to keep your bowels regular during the postoperative period. Straining with bowel movements can cause bleeding. A bowel movement every other day is reasonable. Use a mild laxative if needed, such as Milk of Magnesia 2-3 tablespoons, or 2 Dulcolax tablets. Call if you continue to have problems. If you have been taking narcotics for pain, before, during or after your surgery, you may be constipated. Take a laxative if necessary. ° ° °MEDICATION: °You should resume your pre-surgery medications unless told not to. °You may take oxybutynin or flomax if prescribed for bladder spasms or discomfort from the stent °Take pain medication as directed for pain refractory to conservative management ° °PROBLEMS YOU SHOULD REPORT TO US: °· Fevers over 100.5 Fahrenheit. °· Heavy bleeding, or clots ( See above notes about blood in urine ). °· Inability to urinate. °· Drug reactions ( hives, rash, nausea, vomiting, diarrhea ). °· Severe burning or pain with urination that is not improving. ° ° °General Anesthesia, Adult, Care After °This sheet gives you information about how to care for yourself after your procedure. Your health care provider may also give you more specific instructions. If you have problems or questions, contact your health care provider. °What can I expect after the procedure? °After the procedure, the following side effects are common: °· Pain or discomfort at the IV site. °· Nausea. °· Vomiting. °· Sore throat. °· Trouble concentrating. °· Feeling   cold or chills. °· Weak or tired. °· Sleepiness and fatigue. °· Soreness and body aches. These side effects can affect parts of the body that were not involved in surgery. °Follow these instructions at home: ° °For at least 24 hours after the procedure: °· Have a responsible adult stay with you. It is important to have someone help care for you until you are awake and alert. °· Rest as needed. °· Do not: °? Participate in activities in which you could fall or  become injured. °? Drive. °? Use heavy machinery. °? Drink alcohol. °? Take sleeping pills or medicines that cause drowsiness. °? Make important decisions or sign legal documents. °? Take care of children on your own. °Eating and drinking °· Follow any instructions from your health care provider about eating or drinking restrictions. °· When you feel hungry, start by eating small amounts of foods that are soft and easy to digest (bland), such as toast. Gradually return to your regular diet. °· Drink enough fluid to keep your urine pale yellow. °· If you vomit, rehydrate by drinking water, juice, or clear broth. °General instructions °· If you have sleep apnea, surgery and certain medicines can increase your risk for breathing problems. Follow instructions from your health care provider about wearing your sleep device: °? Anytime you are sleeping, including during daytime naps. °? While taking prescription pain medicines, sleeping medicines, or medicines that make you drowsy. °· Return to your normal activities as told by your health care provider. Ask your health care provider what activities are safe for you. °· Take over-the-counter and prescription medicines only as told by your health care provider. °· If you smoke, do not smoke without supervision. °· Keep all follow-up visits as told by your health care provider. This is important. °Contact a health care provider if: °· You have nausea or vomiting that does not get better with medicine. °· You cannot eat or drink without vomiting. °· You have pain that does not get better with medicine. °· You are unable to pass urine. °· You develop a skin rash. °· You have a fever. °· You have redness around your IV site that gets worse. °Get help right away if: °· You have difficulty breathing. °· You have chest pain. °· You have blood in your urine or stool, or you vomit blood. °Summary °· After the procedure, it is common to have a sore throat or nausea. It is also common  to feel tired. °· Have a responsible adult stay with you for the first 24 hours after general anesthesia. It is important to have someone help care for you until you are awake and alert. °· When you feel hungry, start by eating small amounts of foods that are soft and easy to digest (bland), such as toast. Gradually return to your regular diet. °· Drink enough fluid to keep your urine pale yellow. °· Return to your normal activities as told by your health care provider. Ask your health care provider what activities are safe for you. °This information is not intended to replace advice given to you by your health care provider. Make sure you discuss any questions you have with your health care provider. °Document Released: 11/09/2000 Document Revised: 03/19/2017 Document Reviewed: 03/19/2017 °Elsevier Interactive Patient Education © 2019 [employer] ° ° °

## 2018-10-21 NOTE — Anesthesia Preprocedure Evaluation (Addendum)
Anesthesia Evaluation  Patient identified by MRN, date of birth, ID band Patient awake    Reviewed: Allergy & Precautions, NPO status , Patient's Chart, lab work & pertinent test results  History of Anesthesia Complications Negative for: history of anesthetic complications  Airway Mallampati: I  TM Distance: >3 FB Neck ROM: Full    Dental  (+) Caps, Dental Advisory Given, Loose   Pulmonary neg pulmonary ROS,    breath sounds clear to auscultation       Cardiovascular (-) hypertension+ dysrhythmias (prn diltiazem) Atrial Fibrillation + Valvular Problems/Murmurs  Rhythm:Regular Rate:Normal  Study Conclusions  - Left ventricle: The cavity size was normal. Systolic function was   normal. The estimated ejection fraction was in the range of 55%   to 60%. Wall motion was normal; there were no regional wall   motion abnormalities. Features are consistent with a pseudonormal   left ventricular filling pattern, with concomitant abnormal   relaxation and increased filling pressure (grade 2 diastolic   dysfunction). - Aortic valve: There was mild regurgitation. - Right atrium: The atrium was mildly dilated. - Pulmonary arteries: Systolic pressure was mildly increased. PA   peak pressure: 44 mm Hg (S).   Neuro/Psych negative neurological ROS  negative psych ROS   GI/Hepatic negative GI ROS, Neg liver ROS,   Endo/Other  negative endocrine ROS  Renal/GU negative Renal ROS     Musculoskeletal  (+) Arthritis ,   Abdominal   Peds  Hematology Xarelto   Anesthesia Other Findings   Reproductive/Obstetrics                            Anesthesia Physical  Anesthesia Plan  ASA: III  Anesthesia Plan: General   Post-op Pain Management:    Induction: Intravenous  PONV Risk Score and Plan: 4 or greater and Ondansetron, Dexamethasone, Diphenhydramine and Treatment may vary due to age or medical  condition  Airway Management Planned: LMA  Additional Equipment:   Intra-op Plan:   Post-operative Plan: Extubation in OR  Informed Consent: I have reviewed the patients History and Physical, chart, labs and discussed the procedure including the risks, benefits and alternatives for the proposed anesthesia with the patient or authorized representative who has indicated his/her understanding and acceptance.     Dental advisory given  Plan Discussed with: CRNA and Anesthesiologist  Anesthesia Plan Comments:        Anesthesia Quick Evaluation

## 2018-10-26 ENCOUNTER — Encounter (HOSPITAL_COMMUNITY): Payer: Self-pay | Admitting: Urology

## 2019-02-15 ENCOUNTER — Other Ambulatory Visit (HOSPITAL_COMMUNITY): Payer: Self-pay | Admitting: Nurse Practitioner

## 2019-03-09 ENCOUNTER — Encounter (HOSPITAL_COMMUNITY): Payer: Self-pay | Admitting: Nurse Practitioner

## 2019-03-09 ENCOUNTER — Other Ambulatory Visit: Payer: Self-pay

## 2019-03-09 ENCOUNTER — Ambulatory Visit (HOSPITAL_COMMUNITY)
Admission: RE | Admit: 2019-03-09 | Discharge: 2019-03-09 | Disposition: A | Discharge status: Home / Self Care | Payer: Medicare Other | Source: Ambulatory Visit | Attending: Nurse Practitioner | Admitting: Nurse Practitioner

## 2019-03-09 VITALS — BP 152/82 | HR 59 | Ht 68.0 in | Wt 157.0 lb

## 2019-03-09 DIAGNOSIS — Z8042 Family history of malignant neoplasm of prostate: Secondary | ICD-10-CM | POA: Diagnosis not present

## 2019-03-09 DIAGNOSIS — Z853 Personal history of malignant neoplasm of breast: Secondary | ICD-10-CM | POA: Diagnosis not present

## 2019-03-09 DIAGNOSIS — Z803 Family history of malignant neoplasm of breast: Secondary | ICD-10-CM | POA: Diagnosis not present

## 2019-03-09 DIAGNOSIS — Z79899 Other long term (current) drug therapy: Secondary | ICD-10-CM | POA: Insufficient documentation

## 2019-03-09 DIAGNOSIS — Z880 Allergy status to penicillin: Secondary | ICD-10-CM | POA: Diagnosis not present

## 2019-03-09 DIAGNOSIS — Z8249 Family history of ischemic heart disease and other diseases of the circulatory system: Secondary | ICD-10-CM | POA: Insufficient documentation

## 2019-03-09 DIAGNOSIS — I4892 Unspecified atrial flutter: Secondary | ICD-10-CM | POA: Insufficient documentation

## 2019-03-09 DIAGNOSIS — I480 Paroxysmal atrial fibrillation: Secondary | ICD-10-CM

## 2019-03-09 DIAGNOSIS — Z7901 Long term (current) use of anticoagulants: Secondary | ICD-10-CM | POA: Insufficient documentation

## 2019-03-09 NOTE — Progress Notes (Signed)
Primary Care Physician: Harlan Stains, MD Referring Physician: Dr. Lynwood Dawley Cathy Montoya is a 73 y.o. female with a h/o atrial flutter ablation, 04/2018. After the procedure, afib was noted. She has 30 mg Cardizem which she takes for breakthrough afib. She noted more afib in March of 2020 after kidney surgery for stone extraction. She still has breakthrough afib 1-2 a month lasting 4-8 hours at a time. Usually responds to 30 mg cardizem.    Today, she denies symptoms of palpitations, chest pain, shortness of breath, orthopnea, PND, lower extremity edema, dizziness, presyncope, syncope, or neurologic sequela. The patient is tolerating medications without difficulties and is otherwise without complaint today.   Past Medical History:  Diagnosis Date  . Abnormal Pap smear   . Acne rosacea   . Alopecia    frontal  . Arthritis    knees, lt  hip, lower back, hands, shoulders  . Breast cancer, right ([place]) 04/11/2004   invasive lobular carcinoma treated with 1 chemo treatment and radiation  . Cat allergies   . Dysrhythmia   . History of kidney stones   . Intermittent vertigo    FRONTAL FIBROSIS ALOPECIA  . Lipoma   . Shoulder pain, left   . Typical atrial flutter [facility])    Past Surgical History:  Procedure Laterality Date  . A-FLUTTER ABLATION N/A 04/21/2018   Procedure: A-FLUTTER ABLATION;  Surgeon: Thompson Grayer, MD;  Location: [facility];  Service: Cardiovascular;  Laterality: N/A;  . BREAST LUMPECTOMY  01/10/2004   lymph node, chemo x 1/radiation  . COLONOSCOPY    . CYSTOSCOPY/URETEROSCOPY/HOLMIUM LASER/STENT PLACEMENT Bilateral 10/21/2018   Procedure: CYSTOSCOPY/BILATERAL RETROGRADE PYELOGRAM / BILATERAL URETEROSCOPY/ BILATERAL STENT PLACEMENT/BILATERAL STONE BASKETING;  Surgeon: Lucas Mallow, MD;  Location: [facility];  Service: Urology;  Laterality: Bilateral;  . GYNECOLOGIC CRYOSURGERY  1988   cervical dysplasia    Current Outpatient Medications  Medication Sig  Dispense Refill  . glucosamine-chondroitin 500-400 MG tablet Take 1 tablet by mouth daily.    Marland Kitchen latanoprost (XALATAN) 0.005 % ophthalmic solution 1 drop.    . metroNIDAZOLE (METROCREAM) 0.75 % cream Apply topically 2 (two) times daily.    . Multiple Vitamin (MULTIVITAMIN) capsule Take 1 capsule by mouth daily.    . timolol (TIMOPTIC) 0.5 % ophthalmic solution Place 1 drop into both eyes 2 (two) times daily.  24  . XARELTO 20 MG TABS tablet TAKE 1 TABLET BY MOUTH DAILY WITH SUPPER. 30 tablet 3  . diltiazem (CARDIZEM) 30 MG tablet Take 1-2 tablets (30-60 mg total) by mouth every 4 (four) hours as needed (High heart rates). (Patient not taking: Reported on 03/09/2019) 45 tablet 2   No current facility-administered medications for this encounter.     Allergies  Allergen Reactions  . Amoxicillin Rash    Summer 2019 Rash on Face and stomach    Social History   Socioeconomic History  . Marital status: Married    Spouse name: Not on file  . Number of children: 2  . Years of education: Not on file  . Highest education level: Not on file  Occupational History  . Occupation: Firefighter  Social Needs  . Financial resource strain: Not on file  . Food insecurity    Worry: Not on file    Inability: Not on file  . Transportation needs    Medical: Not on file    Non-medical: Not on file  Tobacco Use  . Smoking status: Never Smoker  .  Smokeless tobacco: Never Used  Substance and Sexual Activity  . Alcohol use: Yes    Comment: wine-occ  . Drug use: No  . Sexual activity: Yes    Partners: Male    Birth control/protection: Post-menopausal  Lifestyle  . Physical activity    Days per week: Not on file    Minutes per session: Not on file  . Stress: Not on file  Relationships  . Social Herbalist on phone: Not on file    Gets together: Not on file    Attends religious service: Not on file    Active member of club or organization: Not on file    Attends meetings of clubs  or organizations: Not on file    Relationship status: Not on file  . Intimate partner violence    Fear of current or ex partner: Not on file    Emotionally abused: Not on file    Physically abused: Not on file    Forced sexual activity: Not on file  Other Topics Concern  . Not on file  Social History Narrative  . Not on file    Family History  Problem Relation Age of Onset  . Hypertension Father   . Cancer Father        PROSTATE  . Thyroid disease Mother   . Alzheimer's disease Mother   . Breast cancer Sister 35       mental retardation  . Breast cancer Paternal Grandmother   . Cancer Maternal Grandmother        unknown type  . Breast cancer Maternal Grandmother   . Breast cancer Other        aunt  . Cancer Other        aunt, unknown type-had hysterectomy  . Thyroid disease Daughter        cyst on thyroid-removed partial thyroid  . Other Maternal Grandfather        TRAIN WRECK  . Colon cancer Neg Hx     ROS- All systems are reviewed and negative except as per the HPI above  Physical Exam: Vitals:   03/09/19 0936  BP: (!) 152/82  Pulse: (!) 59  Weight: 71.2 kg  Height: 5\' 8"  (1.727 m)   Wt Readings from Last 3 Encounters:  03/09/19 71.2 kg  10/21/18 73.1 kg  08/29/18 71.2 kg    Labs: Lab Results  Component Value Date   NA 139 10/21/2018   K 4.1 10/21/2018   CL 105 10/21/2018   CO2 25 10/21/2018   GLUCOSE 89 10/21/2018   BUN 23 10/21/2018   CREATININE 0.84 10/21/2018   CALCIUM 9.6 10/21/2018   No results found for: INR No results found for: CHOL, HDL, LDLCALC, TRIG   GEN- The patient is well appearing, alert and oriented x 3 today.   Head- normocephalic, atraumatic Eyes-  Sclera clear, conjunctiva pink Ears- hearing intact Oropharynx- clear Neck- supple, no JVP Lymph- no cervical lymphadenopathy Lungs- Clear to ausculation bilaterally, normal work of breathing Heart- Regular rate and rhythm, no murmurs, rubs or gallops, PMI not laterally  displaced GI- soft, NT, ND, + BS Extremities- no clubbing, cyanosis, or edema MS- no significant deformity or atrophy Skin- no rash or lesion Psych- euthymic mood, full affect Neuro- strength and sensation are intact  EKG- sinus brady at 59 bpm Epic records reviewed    Assessment and Plan: 1. Aflutter/fib S/p flutter ablation 04/2018 But with afib noted after procedure Currently she is happy to continue  with 30 mg Cardizem for her occasional breakthrough episodes I did offer daily diltiazem to help with frequency of episodes but she declined daily meds   2.[id number] of 2 Continue eliquis 5 mg bid per pt preference    F/u here in 6 months sooner if needed  Butch Penny C. Donnetta Gillin, [facility] [address] [place], [place] [address] [phone]

## 2019-03-15 ENCOUNTER — Telehealth (HOSPITAL_COMMUNITY): Payer: Self-pay | Admitting: *Deleted

## 2019-03-15 NOTE — Telephone Encounter (Signed)
Patient called in stating she went back into af on Sunday night. HRs in the 130s. Has been taking cardizem 30mg  tabs which help rate but not rhythm. Pt still has PIP flecainide at home - discussed with Roderic Palau NP will do 30mg  of cardizem now then in 1 hour take PIP flecainide 300mg  X 1 dose. Pt will call back this afternoon with report.

## 2019-03-17 ENCOUNTER — Other Ambulatory Visit: Payer: Self-pay

## 2019-03-17 ENCOUNTER — Ambulatory Visit (HOSPITAL_COMMUNITY)
Admission: RE | Admit: 2019-03-17 | Discharge: 2019-03-17 | Disposition: A | Discharge status: Home / Self Care | Payer: Medicare Other | Source: Ambulatory Visit | Attending: Physician Assistant | Admitting: Physician Assistant

## 2019-03-17 ENCOUNTER — Telehealth (HOSPITAL_COMMUNITY): Payer: Self-pay | Admitting: *Deleted

## 2019-03-17 VITALS — BP 132/90 | HR 129 | Ht 68.0 in | Wt 156.0 lb

## 2019-03-17 DIAGNOSIS — Z8249 Family history of ischemic heart disease and other diseases of the circulatory system: Secondary | ICD-10-CM | POA: Diagnosis not present

## 2019-03-17 DIAGNOSIS — L659 Nonscarring hair loss, unspecified: Secondary | ICD-10-CM | POA: Diagnosis not present

## 2019-03-17 DIAGNOSIS — Z853 Personal history of malignant neoplasm of breast: Secondary | ICD-10-CM | POA: Insufficient documentation

## 2019-03-17 DIAGNOSIS — Z79899 Other long term (current) drug therapy: Secondary | ICD-10-CM | POA: Insufficient documentation

## 2019-03-17 DIAGNOSIS — Z87442 Personal history of urinary calculi: Secondary | ICD-10-CM | POA: Diagnosis not present

## 2019-03-17 DIAGNOSIS — I483 Typical atrial flutter: Secondary | ICD-10-CM | POA: Diagnosis not present

## 2019-03-17 DIAGNOSIS — I4819 Other persistent atrial fibrillation: Secondary | ICD-10-CM | POA: Insufficient documentation

## 2019-03-17 DIAGNOSIS — Z803 Family history of malignant neoplasm of breast: Secondary | ICD-10-CM | POA: Diagnosis not present

## 2019-03-17 DIAGNOSIS — Z7901 Long term (current) use of anticoagulants: Secondary | ICD-10-CM | POA: Diagnosis not present

## 2019-03-17 MED ORDER — DILTIAZEM HCL ER COATED BEADS 120 MG PO CP24: 120.0000 mg | ORAL_CAPSULE | Freq: Every day | ORAL | 3 refills | Status: DC

## 2019-03-17 NOTE — Telephone Encounter (Signed)
Pt cld x 2 to let us know that she did do the PIP flecainide and she is still out of rhythm with HRs of 111-127.  Pt was offered an appt today with Adline Peals, PA for set up of possible dccv.

## 2019-03-17 NOTE — Progress Notes (Signed)
Primary Care Physician: Harlan Stains, MD Referring Physician: Dr. Lynwood Dawley Cathy Montoya is a 73 y.o. female with a h/o atrial flutter ablation, 04/2018. After the procedure, afib was noted. She has 30 mg Cardizem which she takes for breakthrough afib. She noted more afib in March of 2020 after kidney surgery for stone extraction. She still has breakthrough afib 1-2 a month lasting 4-8 hours at a time. Usually responds to 30 mg cardizem. She denies snoring and does not drink significant alcohol or caffeine. She has prn flecainide 300 mg x 1 + 30 mg cardizem to use for repeat events.   On follow up today, patient reports that she felt her heart racing and felt SOB on exertion on 03/12/19. This is similar to her afib/flutter symptoms she has had in the past. She has taken her PRN diltiazem which has slowed her heart rate. She also tried pill in pocket 300 mg flecainide x1 yesterday. Her symptoms persisted and so she called and made an appointment. She is in atrial flutter today. She denies any specific triggers. She has not missed any doses of anticoagulation.    Today, she denies symptoms of chest pain, orthopnea, PND, lower extremity edema, dizziness, presyncope, syncope, or neurologic sequela. The patient is tolerating medications without difficulties and is otherwise without complaint today.   Past Medical History:  Diagnosis Date   Abnormal Pap smear    Acne rosacea    Alopecia    frontal   Arthritis    knees, lt  hip, lower back, hands, shoulders   Breast cancer, right ([place]) 04/11/2004   invasive lobular carcinoma treated with 1 chemo treatment and radiation   Cat allergies    Dysrhythmia    History of kidney stones    Intermittent vertigo    FRONTAL FIBROSIS ALOPECIA   Lipoma    Shoulder pain, left    Typical atrial flutter ([place])    Past Surgical History:  Procedure Laterality Date   A-FLUTTER ABLATION N/A 04/21/2018   Procedure: A-FLUTTER ABLATION;  Surgeon:  Thompson Grayer, MD;  Location: [place] [facility];  Service: Cardiovascular;  Laterality: N/A;   BREAST LUMPECTOMY  01/10/2004   lymph node, chemo x 1/radiation   COLONOSCOPY     CYSTOSCOPY/URETEROSCOPY/HOLMIUM LASER/STENT PLACEMENT Bilateral 10/21/2018   Procedure: CYSTOSCOPY/BILATERAL RETROGRADE PYELOGRAM / BILATERAL URETEROSCOPY/ BILATERAL STENT PLACEMENT/BILATERAL STONE BASKETING;  Surgeon: Lucas Mallow, MD;  [facility];  Service: Urology;  Laterality: Bilateral;   GYNECOLOGIC CRYOSURGERY  1988   cervical dysplasia    Current Outpatient Medications  Medication Sig Dispense Refill   glucosamine-chondroitin 500-400 MG tablet Take 1 tablet by mouth daily.     latanoprost (XALATAN) 0.005 % ophthalmic solution 1 drop.     metroNIDAZOLE (METROCREAM) 0.75 % cream Apply topically 2 (two) times daily.     Multiple Vitamin (MULTIVITAMIN) capsule Take 1 capsule by mouth daily.     timolol (TIMOPTIC) 0.5 % ophthalmic solution Place 1 drop into both eyes 2 (two) times daily.  24   XARELTO 20 MG TABS tablet TAKE 1 TABLET BY MOUTH DAILY WITH SUPPER. 30 tablet 3   diltiazem (CARDIZEM CD) 120 MG 24 hr capsule Take 1 capsule (120 mg total) by mouth daily. 90 capsule 3   diltiazem (CARDIZEM) 30 MG tablet Take 1-2 tablets (30-60 mg total) by mouth every 4 (four) hours as needed (High heart rates). (Patient not taking: Reported on 03/17/2019) 45 tablet 2   No current facility-administered medications for  this encounter.     Allergies  Allergen Reactions   Amoxicillin Rash    Summer 2019 Rash on Face and stomach    Social History   Socioeconomic History   Marital status: Married    Spouse name: Not on file   Number of children: 2   Years of education: Not on file   Highest education level: Not on file  Occupational History   Occupation: Firefighter  Social Needs   Financial resource strain: Not on file   Food insecurity    Worry: Not on file    Inability: Not  on file   Transportation needs    Medical: Not on file    Non-medical: Not on file  Tobacco Use   Smoking status: Never Smoker   Smokeless tobacco: Never Used  Substance and Sexual Activity   Alcohol use: Yes    Comment: wine-occ   Drug use: No   Sexual activity: Yes    Partners: Male    Birth control/protection: Post-menopausal  Lifestyle   Physical activity    Days per week: Not on file    Minutes per session: Not on file   Stress: Not on file  Relationships   Social connections    Talks on phone: Not on file    Gets together: Not on file    Attends religious service: Not on file    Active member of club or organization: Not on file    Attends meetings of clubs or organizations: Not on file    Relationship status: Not on file   Intimate partner violence    Fear of current or ex partner: Not on file    Emotionally abused: Not on file    Physically abused: Not on file    Forced sexual activity: Not on file  Other Topics Concern   Not on file  Social History Narrative   Not on file    Family History  Problem Relation Age of Onset   Hypertension Father    Cancer Father        PROSTATE   Thyroid disease Mother    Alzheimer's disease Mother    Breast cancer Sister 34       mental retardation   Breast cancer Paternal Grandmother    Cancer Maternal Grandmother        unknown type   Breast cancer Maternal Grandmother    Breast cancer Other        aunt   Cancer Other        aunt, unknown type-had hysterectomy   Thyroid disease Daughter        cyst on thyroid-removed partial thyroid   Other Maternal Grandfather        TRAIN WRECK   Colon cancer Neg Hx     ROS- All systems are reviewed and negative except as per the HPI above  Physical Exam: Vitals:   03/17/19 1406  BP: 132/90  Pulse: (!) 129  Weight: 70.8 kg  Height: 5\' 8"  (1.727 m)   Wt Readings from Last 3 Encounters:  03/17/19 70.8 kg  03/09/19 71.2 kg  10/21/18 73.1 kg     Labs: Lab Results  Component Value Date   NA 139 10/21/2018   K 4.1 10/21/2018   CL 105 10/21/2018   CO2 25 10/21/2018   GLUCOSE 89 10/21/2018   BUN 23 10/21/2018   CREATININE 0.84 10/21/2018   CALCIUM 9.6 10/21/2018   No results found for: INR No results found for: CHOL,  HDL, LDLCALC, TRIG   GEN- The patient is well appearing, alert and oriented x 3 today.   HEENT-head normocephalic, atraumatic, sclera clear, conjunctiva pink, hearing intact, trachea midline. Lungs- Clear to ausculation bilaterally, normal work of breathing Heart- irregular rate and rhythm, no murmurs, rubs or gallops  GI- soft, NT, ND, + BS Extremities- no clubbing, cyanosis, or edema MS- no significant deformity or atrophy Skin- no rash or lesion Psych- euthymic mood, full affect Neuro- strength and sensation are intact   EKG- atrial flutter HR 129, QRS 172, QTc 480  Epic records reviewed   Assessment and Plan: 1. Aflutter/persistent atrial fibrillation  S/p flutter ablation 04/2018 with afib noted after procedure. Patient in atrial flutter today. Failed PIP flecainide. We discussed therapeutic options including daily flecainide vs DCCV. Patient would like to take time to consider her options. ER precautions given. Will start diltiazem 120 mg daily for rate control.  Continue diltiazem 30 mg PRN q4 hrs for heart racing. Continue eliquis 5 mg bid per pt preference, and in anticipation of cardioversion.   This patients CHA2DS2-VASc Score and unadjusted Ischemic Stroke Rate (% per year) is equal to 2.2 % stroke rate/year from a score of 2  Above score calculated as 1 point each if present [CHF, HTN, DM, Vascular=MI/PAD/Aortic Plaque, Age if 65-74, or Female] Above score calculated as 2 points each if present [Age > 75, or Stroke/TIA/TE]   Follow up in AF clinic in one week.   [facility] [address] [place], [place] [address] [phone]

## 2019-03-17 NOTE — Patient Instructions (Signed)
Your physician has recommended you make the following change in your medication:  Start Diltiazem 120 mg daily  Follow up with Butch Penny next week

## 2019-03-20 ENCOUNTER — Other Ambulatory Visit: Payer: Self-pay | Admitting: Nurse Practitioner

## 2019-03-20 DIAGNOSIS — Z1231 Encounter for screening mammogram for malignant neoplasm of breast: Secondary | ICD-10-CM

## 2019-03-22 ENCOUNTER — Ambulatory Visit (HOSPITAL_COMMUNITY)
Admission: RE | Admit: 2019-03-22 | Discharge: 2019-03-22 | Disposition: A | Discharge status: Home / Self Care | Payer: Medicare Other | Source: Ambulatory Visit | Attending: Nurse Practitioner | Admitting: Nurse Practitioner

## 2019-03-22 ENCOUNTER — Other Ambulatory Visit: Payer: Self-pay

## 2019-03-22 ENCOUNTER — Encounter (HOSPITAL_COMMUNITY): Payer: Self-pay | Admitting: Nurse Practitioner

## 2019-03-22 VITALS — BP 138/94 | HR 136 | Ht 68.0 in | Wt 157.2 lb

## 2019-03-22 DIAGNOSIS — R9431 Abnormal electrocardiogram [ECG] [EKG]: Secondary | ICD-10-CM | POA: Insufficient documentation

## 2019-03-22 DIAGNOSIS — Z8042 Family history of malignant neoplasm of prostate: Secondary | ICD-10-CM | POA: Insufficient documentation

## 2019-03-22 DIAGNOSIS — I4892 Unspecified atrial flutter: Secondary | ICD-10-CM | POA: Insufficient documentation

## 2019-03-22 DIAGNOSIS — Z8249 Family history of ischemic heart disease and other diseases of the circulatory system: Secondary | ICD-10-CM | POA: Insufficient documentation

## 2019-03-22 DIAGNOSIS — Z808 Family history of malignant neoplasm of other organs or systems: Secondary | ICD-10-CM | POA: Diagnosis not present

## 2019-03-22 DIAGNOSIS — Z803 Family history of malignant neoplasm of breast: Secondary | ICD-10-CM | POA: Diagnosis not present

## 2019-03-22 DIAGNOSIS — Z7901 Long term (current) use of anticoagulants: Secondary | ICD-10-CM | POA: Diagnosis not present

## 2019-03-22 DIAGNOSIS — I483 Typical atrial flutter: Secondary | ICD-10-CM

## 2019-03-22 DIAGNOSIS — I4510 Unspecified right bundle-branch block: Secondary | ICD-10-CM | POA: Diagnosis not present

## 2019-03-22 DIAGNOSIS — Z792 Long term (current) use of antibiotics: Secondary | ICD-10-CM | POA: Insufficient documentation

## 2019-03-22 DIAGNOSIS — Z79899 Other long term (current) drug therapy: Secondary | ICD-10-CM | POA: Diagnosis not present

## 2019-03-22 DIAGNOSIS — Z853 Personal history of malignant neoplasm of breast: Secondary | ICD-10-CM | POA: Insufficient documentation

## 2019-03-22 DIAGNOSIS — Z881 Allergy status to other antibiotic agents status: Secondary | ICD-10-CM | POA: Insufficient documentation

## 2019-03-22 LAB — BASIC METABOLIC PANEL
Anion gap: 7 (ref 5–15)
BUN: 21 mg/dL (ref 8–23)
CO2: 27 mmol/L (ref 22–32)
Calcium: 9.8 mg/dL (ref 8.9–10.3)
Chloride: 105 mmol/L (ref 98–111)
Creatinine, Ser: 0.94 mg/dL (ref 0.44–1.00)
GFR calc Af Amer: >60 mL/min (ref >60)
GFR calc non Af Amer: >60 mL/min (ref >60)
Glucose, Bld: 128 mg/dL — ABNORMAL HIGH (ref 70–99)
Potassium: 4.6 mmol/L (ref 3.5–5.1)
Sodium: 139 mmol/L (ref 135–145)

## 2019-03-22 LAB — CBC
HCT: 45.9 % (ref 36.0–46.0)
Hemoglobin: 14.6 g/dL (ref 12.0–15.0)
MCH: 30.2 pg (ref 26.0–34.0)
MCHC: 31.8 g/dL (ref 30.0–36.0)
MCV: 94.8 fL (ref 80.0–100.0)
Platelets: 188 10*3/uL (ref 150–400)
RBC: 4.84 MIL/uL (ref 3.87–5.11)
RDW: 12.7 % (ref 11.5–15.5)
WBC: 7.5 10*3/uL (ref 4.0–10.5)
nRBC: 0 % (ref 0.0–0.2)

## 2019-03-22 LAB — TSH: TSH: 1.575 u[IU]/mL (ref 0.350–4.500)

## 2019-03-22 NOTE — Patient Instructions (Signed)
Cardioversion scheduled for Tuesday, August 11th  - Arrive at the [facility] and go to admitting at 12:30PM  -Do not eat or drink anything after midnight the night prior to your procedure.  - Take all your morning medication with a sip of water prior to arrival.  - You will not be able to drive home after your procedure.

## 2019-03-22 NOTE — Progress Notes (Signed)
Primary Care Physician: Harlan Stains, MD Referring Physician: Dr. Lynwood Dawley Cathy Montoya is a 73 y.o. female with a h/o atrial flutter ablation, 04/2018.  A flutter could not be induced, and per Dr. Jackalyn Lombard note after the procedure, afib was induced at the end of the procedure. She has 30 mg Cardizem which she takes for breakthrough afib. She noted more afib in March of 2020 after kidney surgery for stone extraction. She still has breakthrough afib 1-2 a month lasting 4-8 hours at a time. Usually responds to 30 mg cardizem.   One week ago she went into a fast rhythm. Did not respond to 30 mg cardizem and she was instructed to take her PIP flecainide 300 mg which did not convert her. She was seen in the office on Friday and started on daily diltiazem which has not been effective to control her arrhythmia. EKG reviewed with Dr. Rayann Heman and appears to be a typical flutter. He suggests cardioversion. She appears to be tolerating it well.   Today, she denies symptoms of palpitations, chest pain, shortness of breath, orthopnea, PND, lower extremity edema, dizziness, presyncope, syncope, or neurologic sequela. The patient is tolerating medications without difficulties and is otherwise without complaint today.   Past Medical History:  Diagnosis Date  . Abnormal Pap smear   . Acne rosacea   . Alopecia    frontal  . Arthritis    knees, lt  hip, lower back, hands, shoulders  . Breast cancer, right ([place]) 04/11/2004   invasive lobular carcinoma treated with 1 chemo treatment and radiation  . Cat allergies   . Dysrhythmia   . History of kidney stones   . Intermittent vertigo    FRONTAL FIBROSIS ALOPECIA  . Lipoma   . Shoulder pain, left   . Typical atrial flutter [facility])    Past Surgical History:  Procedure Laterality Date  . A-FLUTTER ABLATION N/A 04/21/2018   Procedure: A-FLUTTER ABLATION;  Surgeon: Thompson Grayer, MD;  Location: [place] [facility];  Service: Cardiovascular;  Laterality: N/A;   . BREAST LUMPECTOMY  01/10/2004   lymph node, chemo x 1/radiation  . COLONOSCOPY    . CYSTOSCOPY/URETEROSCOPY/HOLMIUM LASER/STENT PLACEMENT Bilateral 10/21/2018   Procedure: CYSTOSCOPY/BILATERAL RETROGRADE PYELOGRAM / BILATERAL URETEROSCOPY/ BILATERAL STENT PLACEMENT/BILATERAL STONE BASKETING;  Surgeon: Lucas Mallow, MD;  [facility];  Service: Urology;  Laterality: Bilateral;  . GYNECOLOGIC CRYOSURGERY  1988   cervical dysplasia    Current Outpatient Medications  Medication Sig Dispense Refill  . diltiazem (CARDIZEM CD) 120 MG 24 hr capsule Take 1 capsule (120 mg total) by mouth daily. 90 capsule 3  . glucosamine-chondroitin 500-400 MG tablet Take 1 tablet by mouth daily.    Marland Kitchen latanoprost (XALATAN) 0.005 % ophthalmic solution 1 drop.    . metroNIDAZOLE (METROCREAM) 0.75 % cream Apply topically 2 (two) times daily.    . Multiple Vitamin (MULTIVITAMIN) capsule Take 1 capsule by mouth daily.    . timolol (TIMOPTIC) 0.5 % ophthalmic solution Place 1 drop into both eyes 2 (two) times daily.  24  . XARELTO 20 MG TABS tablet TAKE 1 TABLET BY MOUTH DAILY WITH SUPPER. 30 tablet 3  . diltiazem (CARDIZEM) 30 MG tablet Take 1-2 tablets (30-60 mg total) by mouth every 4 (four) hours as needed (High heart rates). (Patient not taking: Reported on 03/17/2019) 45 tablet 2   No current facility-administered medications for this encounter.     Allergies  Allergen Reactions  . Amoxicillin Rash  Summer 2019 Rash on Face and stomach    Social History   Socioeconomic History  . Marital status: Married    Spouse name: Not on file  . Number of children: 2  . Years of education: Not on file  . Highest education level: Not on file  Occupational History  . Occupation: Firefighter  Social Needs  . Financial resource strain: Not on file  . Food insecurity    Worry: Not on file    Inability: Not on file  . Transportation needs    Medical: Not on file    Non-medical: Not on file   Tobacco Use  . Smoking status: Never Smoker  . Smokeless tobacco: Never Used  Substance and Sexual Activity  . Alcohol use: Yes    Comment: wine-occ  . Drug use: No  . Sexual activity: Yes    Partners: Male    Birth control/protection: Post-menopausal  Lifestyle  . Physical activity    Days per week: Not on file    Minutes per session: Not on file  . Stress: Not on file  Relationships  . Social Herbalist on phone: Not on file    Gets together: Not on file    Attends religious service: Not on file    Active member of club or organization: Not on file    Attends meetings of clubs or organizations: Not on file    Relationship status: Not on file  . Intimate partner violence    Fear of current or ex partner: Not on file    Emotionally abused: Not on file    Physically abused: Not on file    Forced sexual activity: Not on file  Other Topics Concern  . Not on file  Social History Narrative  . Not on file    Family History  Problem Relation Age of Onset  . Hypertension Father   . Cancer Father        PROSTATE  . Thyroid disease Mother   . Alzheimer's disease Mother   . Breast cancer Sister 18       mental retardation  . Breast cancer Paternal Grandmother   . Cancer Maternal Grandmother        unknown type  . Breast cancer Maternal Grandmother   . Breast cancer Other        aunt  . Cancer Other        aunt, unknown type-had hysterectomy  . Thyroid disease Daughter        cyst on thyroid-removed partial thyroid  . Other Maternal Grandfather        TRAIN WRECK  . Colon cancer Neg Hx     ROS- All systems are reviewed and negative except as per the HPI above  Physical Exam: Vitals:   03/22/19 0943  BP: (!) 138/94  Pulse: (!) 136  Weight: 71.3 kg  Height: 5\' 8"  (1.727 m)   Wt Readings from Last 3 Encounters:  03/22/19 71.3 kg  03/17/19 70.8 kg  03/09/19 71.2 kg    Labs: Lab Results  Component Value Date   NA 139 10/21/2018   K 4.1  10/21/2018   CL 105 10/21/2018   CO2 25 10/21/2018   GLUCOSE 89 10/21/2018   BUN 23 10/21/2018   CREATININE 0.84 10/21/2018   CALCIUM 9.6 10/21/2018   No results found for: INR No results found for: CHOL, HDL, LDLCALC, TRIG   GEN- The patient is well appearing, alert and oriented x 3  today.   Head- normocephalic, atraumatic Eyes-  Sclera clear, conjunctiva pink Ears- hearing intact Oropharynx- clear Neck- supple, no JVP Lymph- no cervical lymphadenopathy Lungs- Clear to ausculation bilaterally, normal work of breathing Heart- Rapid regular rate and rhythm, no murmurs, rubs or gallops, PMI not laterally displaced GI- soft, NT, ND, + BS Extremities- no clubbing, cyanosis, or edema MS- no significant deformity or atrophy Skin- no rash or lesion Psych- euthymic mood, full affect Neuro- strength and sensation are intact  EKG-  Typical appearing flutter at 136 bpm, qrs int 128 ms, qtc 520 ms Epic records reviewed    Assessment and Plan: 1. Aflutter/fib with RVR S/p flutter ablation 04/2018 But with afib noted after procedure She can use  to continue with 30 mg Cardizem as needed for rate control  She was offered to go to the ER for an urgent cardioversion She feels she is tolerating ok, weight is stable Continue daily  Diltiazem, may have to stop after DCCV if if has significant bradycardia on return to SR   2.CHA2DS2VASc of 2 Continue eliquis 5 mg bid per pt preference  States no missed doses x at least  3 weeks   F/u here in one week Plan to send her back to Dr. Rayann Heman in the near future   [place]. Mohamud Mrozek, [facility] [address] [place], [place] [address] [phone]

## 2019-03-24 ENCOUNTER — Other Ambulatory Visit (HOSPITAL_COMMUNITY)
Admission: RE | Admit: 2019-03-24 | Discharge: 2019-03-24 | Disposition: A | Discharge status: Home / Self Care | Payer: Medicare Other | Source: Ambulatory Visit | Attending: Cardiology | Admitting: Cardiology

## 2019-03-24 DIAGNOSIS — Z01812 Encounter for preprocedural laboratory examination: Secondary | ICD-10-CM | POA: Diagnosis present

## 2019-03-24 DIAGNOSIS — Z20828 Contact with and (suspected) exposure to other viral communicable diseases: Secondary | ICD-10-CM | POA: Insufficient documentation

## 2019-03-24 LAB — SARS CORONAVIRUS 2 (TAT 6-24 HRS): SARS Coronavirus 2: NEGATIVE

## 2019-03-27 ENCOUNTER — Other Ambulatory Visit: Payer: Self-pay

## 2019-03-27 ENCOUNTER — Ambulatory Visit (HOSPITAL_COMMUNITY)
Admission: RE | Admit: 2019-03-27 | Discharge: 2019-03-27 | Disposition: A | Discharge status: Home / Self Care | Payer: Medicare Other | Source: Ambulatory Visit | Attending: Physician Assistant | Admitting: Physician Assistant

## 2019-03-27 VITALS — BP 136/74 | HR 64

## 2019-03-27 DIAGNOSIS — I480 Paroxysmal atrial fibrillation: Secondary | ICD-10-CM | POA: Diagnosis present

## 2019-03-27 NOTE — Progress Notes (Addendum)
Pt in for EKG to determine if she is back in NSR.  Pt scheduled for dccv tomorrow morning.  To be reviewed by Adline Peals, PA   Pt reports that she felt she was back in SR last evening. ECG today confirms SR with HR 64, incRBBB, PR 136, QRS 94, QTc 437. Will continue diltiazem. Will cancel DCCV. Follow up with Dr Rayann Heman as scheduled.

## 2019-03-28 ENCOUNTER — Encounter (HOSPITAL_COMMUNITY): Admission: RE | Payer: Self-pay | Source: Ambulatory Visit

## 2019-03-28 ENCOUNTER — Ambulatory Visit (HOSPITAL_COMMUNITY): Admission: RE | Admit: 2019-03-28 | Payer: Medicare Other | Source: Ambulatory Visit | Admitting: Cardiology

## 2019-03-28 SURGERY — CARDIOVERSION
Anesthesia: General

## 2019-04-05 ENCOUNTER — Ambulatory Visit (HOSPITAL_COMMUNITY): Payer: Medicare Other | Admitting: Nurse Practitioner

## 2019-04-10 ENCOUNTER — Telehealth: Payer: Self-pay

## 2019-04-10 NOTE — Telephone Encounter (Signed)
Spoke with pt regarding appt on 04/12/19. Pt was advise to check her vitals prior to appt. Pt questions and concerns were address.

## 2019-04-12 ENCOUNTER — Telehealth (HOSPITAL_COMMUNITY): Payer: Self-pay | Admitting: Emergency Medicine

## 2019-04-12 ENCOUNTER — Telehealth: Payer: Self-pay

## 2019-04-12 ENCOUNTER — Telehealth (INDEPENDENT_AMBULATORY_CARE_PROVIDER_SITE_OTHER): Payer: Medicare Other | Admitting: Internal Medicine

## 2019-04-12 ENCOUNTER — Encounter: Payer: Self-pay | Admitting: Internal Medicine

## 2019-04-12 VITALS — Ht 68.0 in | Wt 156.0 lb

## 2019-04-12 DIAGNOSIS — I480 Paroxysmal atrial fibrillation: Secondary | ICD-10-CM | POA: Diagnosis not present

## 2019-04-12 DIAGNOSIS — I483 Typical atrial flutter: Secondary | ICD-10-CM

## 2019-04-12 MED ORDER — METOPROLOL TARTRATE 100 MG PO TABS: 100.0000 mg | ORAL_TABLET | Freq: Once | ORAL | 0 refills | Status: DC

## 2019-04-12 NOTE — Telephone Encounter (Signed)
Reaching out to patient to offer assistance regarding upcoming cardiac imaging study; pt verbalizes understanding of appt date/time, parking situation and where to check in, pre-test NPO status and medications ordered, and verified current allergies; name and call back number provided for further questions should they arise Keyly Baldonado RN Navigator Cardiac Imaging [facility] [phone] office [phone] cell 

## 2019-04-12 NOTE — Progress Notes (Signed)
Electrophysiology TeleHealth Note   Due to national recommendations of social distancing due to COVID 19, an audio/video telehealth visit is felt to be most appropriate for this patient at this time.  See MyChart message from today for the patient's consent to telehealth for [facility].   Date:  04/12/2019   ID:  Cathy Montoya, DOB 24-Sep-1945, MRN [id number]  Location: patient's home  Provider location:  [facility]  Evaluation Performed: Follow-up visit  PCP:  Harlan Stains, MD   Electrophysiologist:  Dr Rayann Heman  Chief Complaint:  palpitations  History of Present Illness:    Cathy Montoya is a 73 y.o. female who presents via telehealth conferencing today.  Since last being seen in our clinic, the patient reports doing reasonably well.  She has ongoing issues with atrial arrhythmias.  She has had both afib and recurrent atrial flutter (S/p prior CTI ablation).  She has frequent heart rates 40s with fatigue which limits medical therapy.  She also has episodes of RVR.  + SOB and fatigue during arrhythmias. Today, she denies symptoms of chest pain, lower extremity edema, dizziness, presyncope, or syncope.  The patient is otherwise without complaint today.  The patient denies symptoms of fevers, chills, cough, or new SOB worrisome for COVID 19.  Past Medical History:  Diagnosis Date  . Abnormal Pap smear   . Acne rosacea   . Alopecia    frontal  . Arthritis    knees, lt  hip, lower back, hands, shoulders  . Breast cancer, right ([place]) 04/11/2004   invasive lobular carcinoma treated with 1 chemo treatment and radiation  . Cat allergies   . History of kidney stones   . Intermittent vertigo    FRONTAL FIBROSIS ALOPECIA  . Lipoma   . Paroxysmal atrial fibrillation ([facility])   . Shoulder pain, left   . Typical atrial flutter [facility])     Past Surgical History:  Procedure Laterality Date  . A-FLUTTER ABLATION N/A 04/21/2018   Procedure: A-FLUTTER ABLATION;  Surgeon: Thompson Grayer, MD;  Location: [place] [facility];  Service: Cardiovascular;  Laterality: N/A;  . BREAST LUMPECTOMY  01/10/2004   lymph node, chemo x 1/radiation  . COLONOSCOPY    . CYSTOSCOPY/URETEROSCOPY/HOLMIUM LASER/STENT PLACEMENT Bilateral 10/21/2018   Procedure: CYSTOSCOPY/BILATERAL RETROGRADE PYELOGRAM / BILATERAL URETEROSCOPY/ BILATERAL STENT PLACEMENT/BILATERAL STONE BASKETING;  Surgeon: Lucas Mallow, MD;  [facility];  Service: Urology;  Laterality: Bilateral;  . GYNECOLOGIC CRYOSURGERY  1988   cervical dysplasia    Current Outpatient Medications  Medication Sig Dispense Refill  . diltiazem (CARDIZEM CD) 120 MG 24 hr capsule Take 1 capsule (120 mg total) by mouth daily. 90 capsule 3  . diltiazem (CARDIZEM) 30 MG tablet Take 1-2 tablets (30-60 mg total) by mouth every 4 (four) hours as needed (High heart rates). (Patient taking differently: Take 30-60 mg by mouth every 4 (four) hours as needed (High heart rates). For high heart rates) 45 tablet 2  . Glucosamine-Chondroitin (COSAMIN DS PO) Take 1 tablet by mouth daily with supper.    . latanoprost (XALATAN) 0.005 % ophthalmic solution Place 1 drop into both eyes at bedtime.     . metroNIDAZOLE (METROCREAM) 0.75 % cream Apply 1 application topically 2 (two) times daily as needed (rosacea.).     Marland Kitchen Multiple Vitamin (MULTIVITAMIN WITH MINERALS) TABS tablet Take 1 tablet by mouth daily with supper. Centrum Silver for Women    . timolol (TIMOPTIC) 0.5 % ophthalmic solution Place 1  drop into both eyes 2 (two) times daily.  24  . XARELTO 20 MG TABS tablet TAKE 1 TABLET BY MOUTH DAILY WITH SUPPER. (Patient taking differently: Take 20 mg by mouth daily with supper. ) 30 tablet 3   No current facility-administered medications for this visit.     Allergies:   Amoxicillin   Social History:  The patient  reports that she has never smoked. She has never used smokeless tobacco. She reports current alcohol use. She reports that she does not use  drugs.   Family History:  The patient's family history includes Alzheimer's disease in her mother; Breast cancer in her maternal grandmother, paternal grandmother, and another family member; Breast cancer (age of onset: 47) in her sister; Cancer in her father, maternal grandmother, and another family member; Hypertension in her father; Other in her maternal grandfather; Thyroid disease in her daughter and mother.   ROS:  Please see the history of present illness.   All other systems are personally reviewed and negative.    Exam:    Vital Signs:  Ht 5\' 8"  (1.727 m)   Wt 156 lb (70.8 kg)   LMP 08/17/1998   BMI 23.72 kg/m   Well sounding and appearing, alert and conversant, regular work of breathing,  good skin color Eyes- anicteric, neuro- grossly intact, skin- no apparent rash or lesions or cyanosis, mouth- oral mucosa is pink  Labs/Other Tests and Data Reviewed:    Recent Labs: 03/22/2019: BUN 21; Creatinine, Ser 0.94; Hemoglobin 14.6; Platelets 188; Potassium 4.6; Sodium 139; TSH 1.575   Wt Readings from Last 3 Encounters:  04/12/19 156 lb (70.8 kg)  03/22/19 157 lb 3.2 oz (71.3 kg)  03/17/19 156 lb (70.8 kg)    Recent ecgs reveal return of typical atrial flutter  ASSESSMENT & PLAN:    1.  Paroxysmal atrial fibrillation and typical atrial flutter The patient has symptomatic, recurrent atrial fibrillation and typical atrial flutter.  She is s/p prior CTI ablation 04/21/2018 during which she was noted to have a prominent pouch along her isthmus.  She also developed incessant afib during that procedure. She has been documented to have return of typical flutter as well as afib on ekgs which I have personally reviewed she has failed medical therapy with diltiazem and pill in pocket flecainide.  She has symptomatic sinus bradycardia which limits our treatment options. Chads2vasc score is 2.  she is anticoagulated with xarelto . Therapeutic strategies for afib including medicine and  ablation were discussed in detail with the patient today. Risk, benefits, and alternatives to EP study and radiofrequency ablation for afib were also discussed in detail today. These risks include but are not limited to stroke, bleeding, vascular damage, tamponade, perforation, damage to the esophagus, lungs, and other structures, pulmonary vein stenosis, worsening renal function, and death. The patient understands these risk and wishes to proceed.  We will therefore proceed with catheter ablation at the next available time.  Carto, ICE, anesthesia are requested for the procedure.  Will also obtain cardiac CT prior to the procedure to exclude LAA thrombus and further evaluate atrial anatomy.    Patient Risk:  after full review of this patients clinical status, I feel that they are at highrisk at this time.  Today, I have spent 20 minutes with the patient with telehealth technology discussing arrhythmia management .    Army Fossa, MD  04/12/2019 11:38 AM     [facility] [address] [place] [address] (  [phone] (office) [phone] (fax)

## 2019-04-12 NOTE — Telephone Encounter (Signed)
-----   Message from Thompson Grayer, MD sent at 04/12/2019 11:33 AM EDT ----- Afib ablation with C/I/A  Cardiac CT  Next available time

## 2019-04-12 NOTE — H&P (View-Only) (Signed)
Electrophysiology TeleHealth Note   Due to national recommendations of social distancing due to COVID 19, an audio/video telehealth visit is felt to be most appropriate for this patient at this time.  See MyChart message from today for the patient's consent to telehealth for [facility].   Date:  04/12/2019   ID:  Cathy Montoya, DOB Oct 15, 1945, MRN [id number]  Location: patient's home  Provider location:  [facility]  Evaluation Performed: Follow-up visit  PCP:  Harlan Stains, MD   Electrophysiologist:  Dr Rayann Heman  Chief Complaint:  palpitations  History of Present Illness:    Cathy Montoya is a 73 y.o. female who presents via telehealth conferencing today.  Since last being seen in our clinic, the patient reports doing reasonably well.  She has ongoing issues with atrial arrhythmias.  She has had both afib and recurrent atrial flutter (S/p prior CTI ablation).  She has frequent heart rates 40s with fatigue which limits medical therapy.  She also has episodes of RVR.  + SOB and fatigue during arrhythmias. Today, she denies symptoms of chest pain, lower extremity edema, dizziness, presyncope, or syncope.  The patient is otherwise without complaint today.  The patient denies symptoms of fevers, chills, cough, or new SOB worrisome for COVID 19.  Past Medical History:  Diagnosis Date  . Abnormal Pap smear   . Acne rosacea   . Alopecia    frontal  . Arthritis    knees, lt  hip, lower back, hands, shoulders  . Breast cancer, right ([place]) 04/11/2004   invasive lobular carcinoma treated with 1 chemo treatment and radiation  . Cat allergies   . History of kidney stones   . Intermittent vertigo    FRONTAL FIBROSIS ALOPECIA  . Lipoma   . Paroxysmal atrial fibrillation (HCC)   . Shoulder pain, left   . Typical atrial flutter [facility])     Past Surgical History:  Procedure Laterality Date  . A-FLUTTER ABLATION N/A 04/21/2018   Procedure: A-FLUTTER ABLATION;  Surgeon: Thompson Grayer, MD;  Location: [facility];  Service: Cardiovascular;  Laterality: N/A;  . BREAST LUMPECTOMY  01/10/2004   lymph node, chemo x 1/radiation  . COLONOSCOPY    . CYSTOSCOPY/URETEROSCOPY/HOLMIUM LASER/STENT PLACEMENT Bilateral 10/21/2018   Procedure: CYSTOSCOPY/BILATERAL RETROGRADE PYELOGRAM / BILATERAL URETEROSCOPY/ BILATERAL STENT PLACEMENT/BILATERAL STONE BASKETING;  Surgeon: Lucas Mallow, MD;  Location: [facility];  Service: Urology;  Laterality: Bilateral;  . GYNECOLOGIC CRYOSURGERY  1988   cervical dysplasia    Current Outpatient Medications  Medication Sig Dispense Refill  . diltiazem (CARDIZEM CD) 120 MG 24 hr capsule Take 1 capsule (120 mg total) by mouth daily. 90 capsule 3  . diltiazem (CARDIZEM) 30 MG tablet Take 1-2 tablets (30-60 mg total) by mouth every 4 (four) hours as needed (High heart rates). (Patient taking differently: Take 30-60 mg by mouth every 4 (four) hours as needed (High heart rates). For high heart rates) 45 tablet 2  . Glucosamine-Chondroitin (COSAMIN DS PO) Take 1 tablet by mouth daily with supper.    . latanoprost (XALATAN) 0.005 % ophthalmic solution Place 1 drop into both eyes at bedtime.     . metroNIDAZOLE (METROCREAM) 0.75 % cream Apply 1 application topically 2 (two) times daily as needed (rosacea.).     Marland Kitchen Multiple Vitamin (MULTIVITAMIN WITH MINERALS) TABS tablet Take 1 tablet by mouth daily with supper. Centrum Silver for Women    . timolol (TIMOPTIC) 0.5 % ophthalmic solution Place 1  drop into both eyes 2 (two) times daily.  24  . XARELTO 20 MG TABS tablet TAKE 1 TABLET BY MOUTH DAILY WITH SUPPER. (Patient taking differently: Take 20 mg by mouth daily with supper. ) 30 tablet 3   No current facility-administered medications for this visit.     Allergies:   Amoxicillin   Social History:  The patient  reports that she has never smoked. She has never used smokeless tobacco. She reports current alcohol use. She reports that she does not use  drugs.   Family History:  The patient's family history includes Alzheimer's disease in her mother; Breast cancer in her maternal grandmother, paternal grandmother, and another family member; Breast cancer (age of onset: 40) in her sister; Cancer in her father, maternal grandmother, and another family member; Hypertension in her father; Other in her maternal grandfather; Thyroid disease in her daughter and mother.   ROS:  Please see the history of present illness.   All other systems are personally reviewed and negative.    Exam:    Vital Signs:  Ht 5\' 8"  (1.727 m)   Wt 156 lb (70.8 kg)   LMP 08/17/1998   BMI 23.72 kg/m   Well sounding and appearing, alert and conversant, regular work of breathing,  good skin color Eyes- anicteric, neuro- grossly intact, skin- no apparent rash or lesions or cyanosis, mouth- oral mucosa is pink  Labs/Other Tests and Data Reviewed:    Recent Labs: 03/22/2019: BUN 21; Creatinine, Ser 0.94; Hemoglobin 14.6; Platelets 188; Potassium 4.6; Sodium 139; TSH 1.575   Wt Readings from Last 3 Encounters:  04/12/19 156 lb (70.8 kg)  03/22/19 157 lb 3.2 oz (71.3 kg)  03/17/19 156 lb (70.8 kg)    Recent ecgs reveal return of typical atrial flutter  ASSESSMENT & PLAN:    1.  Paroxysmal atrial fibrillation and typical atrial flutter The patient has symptomatic, recurrent atrial fibrillation and typical atrial flutter.  She is s/p prior CTI ablation 04/21/2018 during which she was noted to have a prominent pouch along her isthmus.  She also developed incessant afib during that procedure. She has been documented to have return of typical flutter as well as afib on ekgs which I have personally reviewed she has failed medical therapy with diltiazem and pill in pocket flecainide.  She has symptomatic sinus bradycardia which limits our treatment options. Chads2vasc score is 2.  she is anticoagulated with xarelto . Therapeutic strategies for afib including medicine and  ablation were discussed in detail with the patient today. Risk, benefits, and alternatives to EP study and radiofrequency ablation for afib were also discussed in detail today. These risks include but are not limited to stroke, bleeding, vascular damage, tamponade, perforation, damage to the esophagus, lungs, and other structures, pulmonary vein stenosis, worsening renal function, and death. The patient understands these risk and wishes to proceed.  We will therefore proceed with catheter ablation at the next available time.  Carto, ICE, anesthesia are requested for the procedure.  Will also obtain cardiac CT prior to the procedure to exclude LAA thrombus and further evaluate atrial anatomy.    Patient Risk:  after full review of this patients clinical status, I feel that they are at highrisk at this time.  Today, I have spent 20 minutes with the patient with telehealth technology discussing arrhythmia management .    Army Fossa, MD  04/12/2019 11:38 AM     [facility] [address] [place] [address] (  [phone] (office) [phone] (fax)

## 2019-04-12 NOTE — Telephone Encounter (Signed)
Outreach made to Pt.  Pt scheduled for afib ablation on 04/18/2019 at 10:30 am.  Pt will be scheduled for cardiac CT prior.  No addl labs needed for Pt.  Pt will go for covid test on 04/14/2019  Will send instruction letters via [place].  Pt states she can see Mychart messages.

## 2019-04-13 ENCOUNTER — Other Ambulatory Visit: Payer: Self-pay

## 2019-04-13 ENCOUNTER — Ambulatory Visit (HOSPITAL_COMMUNITY): Admission: RE | Admit: 2019-04-13 | Payer: Medicare Other | Source: Ambulatory Visit

## 2019-04-13 ENCOUNTER — Ambulatory Visit (HOSPITAL_COMMUNITY)
Admission: RE | Admit: 2019-04-13 | Discharge: 2019-04-13 | Disposition: A | Discharge status: Home / Self Care | Payer: Medicare Other | Source: Ambulatory Visit | Attending: Internal Medicine | Admitting: Internal Medicine

## 2019-04-13 DIAGNOSIS — I480 Paroxysmal atrial fibrillation: Secondary | ICD-10-CM

## 2019-04-13 MED ORDER — IOHEXOL 350 MG/ML SOLN
80.0000 mL | Freq: Once | INTRAVENOUS | Status: AC | PRN
  Administered 2019-04-13: 80 mL via INTRAVENOUS

## 2019-04-14 ENCOUNTER — Other Ambulatory Visit (HOSPITAL_COMMUNITY)
Admission: RE | Admit: 2019-04-14 | Discharge: 2019-04-14 | Disposition: A | Discharge status: Home / Self Care | Payer: Medicare Other | Source: Ambulatory Visit | Attending: Internal Medicine | Admitting: Internal Medicine

## 2019-04-14 DIAGNOSIS — Z20828 Contact with and (suspected) exposure to other viral communicable diseases: Secondary | ICD-10-CM | POA: Insufficient documentation

## 2019-04-14 DIAGNOSIS — Z01812 Encounter for preprocedural laboratory examination: Secondary | ICD-10-CM | POA: Insufficient documentation

## 2019-04-14 NOTE — Progress Notes (Signed)
Patient has not arrived for covid testing appointment that was scheduled at 0825 this morning.  Called patients and left message about appointment and fave instructions about drive thru hours to close at 3 this afternoon

## 2019-04-15 LAB — SARS CORONAVIRUS 2 (TAT 6-24 HRS): SARS Coronavirus 2: NEGATIVE

## 2019-04-18 ENCOUNTER — Ambulatory Visit (HOSPITAL_COMMUNITY)
Admission: RE | Admit: 2019-04-18 | Discharge: 2019-04-18 | Disposition: A | Discharge status: Home / Self Care | Payer: Medicare Other | Source: Ambulatory Visit | Attending: Internal Medicine | Admitting: Internal Medicine

## 2019-04-18 ENCOUNTER — Other Ambulatory Visit: Payer: Self-pay

## 2019-04-18 ENCOUNTER — Encounter (HOSPITAL_COMMUNITY): Payer: Self-pay

## 2019-04-18 ENCOUNTER — Ambulatory Visit (HOSPITAL_COMMUNITY): Payer: Medicare Other | Admitting: Certified Registered"

## 2019-04-18 ENCOUNTER — Encounter (HOSPITAL_COMMUNITY)
Admission: RE | Disposition: A | Discharge status: Home / Self Care | Payer: Self-pay | Source: Ambulatory Visit | Attending: Internal Medicine

## 2019-04-18 DIAGNOSIS — Z79899 Other long term (current) drug therapy: Secondary | ICD-10-CM | POA: Diagnosis not present

## 2019-04-18 DIAGNOSIS — I480 Paroxysmal atrial fibrillation: Secondary | ICD-10-CM | POA: Insufficient documentation

## 2019-04-18 DIAGNOSIS — Z853 Personal history of malignant neoplasm of breast: Secondary | ICD-10-CM | POA: Insufficient documentation

## 2019-04-18 DIAGNOSIS — Z7901 Long term (current) use of anticoagulants: Secondary | ICD-10-CM | POA: Diagnosis not present

## 2019-04-18 DIAGNOSIS — M1990 Unspecified osteoarthritis, unspecified site: Secondary | ICD-10-CM | POA: Diagnosis not present

## 2019-04-18 DIAGNOSIS — I483 Typical atrial flutter: Secondary | ICD-10-CM | POA: Diagnosis not present

## 2019-04-18 DIAGNOSIS — Z880 Allergy status to penicillin: Secondary | ICD-10-CM | POA: Insufficient documentation

## 2019-04-18 HISTORY — PX: ATRIAL FIBRILLATION ABLATION: EP1191

## 2019-04-18 LAB — POCT ACTIVATED CLOTTING TIME
Activated Clotting Time: 274 seconds
Activated Clotting Time: 312 seconds
Activated Clotting Time: 323 seconds

## 2019-04-18 SURGERY — ATRIAL FIBRILLATION ABLATION
Anesthesia: General

## 2019-04-18 MED ORDER — ONDANSETRON HCL 4 MG/2ML IJ SOLN
INTRAMUSCULAR | Status: DC | PRN
  Administered 2019-04-18: 4 mg via INTRAVENOUS

## 2019-04-18 MED ORDER — SUCCINYLCHOLINE CHLORIDE 20 MG/ML IJ SOLN
INTRAMUSCULAR | Status: DC | PRN
  Administered 2019-04-18: 140 mg via INTRAVENOUS

## 2019-04-18 MED ORDER — SUGAMMADEX SODIUM 200 MG/2ML IV SOLN
INTRAVENOUS | Status: DC | PRN
  Administered 2019-04-18: 180 mg via INTRAVENOUS

## 2019-04-18 MED ORDER — PANTOPRAZOLE SODIUM 40 MG PO TBEC: 40.0000 mg | DELAYED_RELEASE_TABLET | Freq: Every day | ORAL | 0 refills | Status: DC

## 2019-04-18 MED ORDER — BUPIVACAINE HCL (PF) 0.25 % IJ SOLN
INTRAMUSCULAR | Status: AC
  Filled 2019-04-18: qty 30

## 2019-04-18 MED ORDER — FENTANYL CITRATE (PF) 100 MCG/2ML IJ SOLN
INTRAMUSCULAR | Status: AC
  Filled 2019-04-18: qty 2

## 2019-04-18 MED ORDER — ISOPROTERENOL HCL 0.2 MG/ML IJ SOLN
INTRAMUSCULAR | Status: AC
  Filled 2019-04-18: qty 5

## 2019-04-18 MED ORDER — LIDOCAINE 2% (20 MG/ML) 5 ML SYRINGE
INTRAMUSCULAR | Status: DC | PRN
  Administered 2019-04-18: 80 mg via INTRAVENOUS

## 2019-04-18 MED ORDER — PROTAMINE SULFATE 10 MG/ML IV SOLN
INTRAVENOUS | Status: DC | PRN
  Administered 2019-04-18: 40 mg via INTRAVENOUS

## 2019-04-18 MED ORDER — SODIUM CHLORIDE 0.9 % IV SOLN
INTRAVENOUS | Status: DC
  Administered 2019-04-18: 09:00:00 via INTRAVENOUS

## 2019-04-18 MED ORDER — HEPARIN SODIUM (PORCINE) 1000 UNIT/ML IJ SOLN
INTRAMUSCULAR | Status: DC | PRN
  Administered 2019-04-18 (×2): 2000 [IU] via INTRAVENOUS
  Administered 2019-04-18: 4000 [IU] via INTRAVENOUS

## 2019-04-18 MED ORDER — HEPARIN SODIUM (PORCINE) 1000 UNIT/ML IJ SOLN
INTRAMUSCULAR | Status: AC
  Filled 2019-04-18: qty 1

## 2019-04-18 MED ORDER — MIDAZOLAM HCL 2 MG/2ML IJ SOLN
INTRAMUSCULAR | Status: AC
  Filled 2019-04-18: qty 2

## 2019-04-18 MED ORDER — HEPARIN (PORCINE) IN NACL 1000-0.9 UT/500ML-% IV SOLN
INTRAVENOUS | Status: AC
  Filled 2019-04-18: qty 500

## 2019-04-18 MED ORDER — PROPOFOL 10 MG/ML IV BOLUS
INTRAVENOUS | Status: DC | PRN
  Administered 2019-04-18: 160 mg via INTRAVENOUS

## 2019-04-18 MED ORDER — DEXAMETHASONE SODIUM PHOSPHATE 10 MG/ML IJ SOLN
INTRAMUSCULAR | Status: DC | PRN
  Administered 2019-04-18: 5 mg via INTRAVENOUS

## 2019-04-18 MED ORDER — HEPARIN SODIUM (PORCINE) 1000 UNIT/ML IJ SOLN
INTRAMUSCULAR | Status: DC | PRN
  Administered 2019-04-18: 12000 [IU] via INTRAVENOUS
  Administered 2019-04-18: 1000 [IU] via INTRAVENOUS

## 2019-04-18 MED ORDER — SODIUM CHLORIDE 0.9 % IV SOLN
INTRAVENOUS | Status: DC | PRN
  Administered 2019-04-18: 15 ug/min via INTRAVENOUS

## 2019-04-18 MED ORDER — HEPARIN (PORCINE) IN NACL 1000-0.9 UT/500ML-% IV SOLN
INTRAVENOUS | Status: DC | PRN
  Administered 2019-04-18: 500 mL

## 2019-04-18 MED ORDER — SODIUM CHLORIDE 0.9% FLUSH: 3.0000 mL | Freq: Two times a day (BID) | INTRAVENOUS | Status: DC

## 2019-04-18 MED ORDER — ROCURONIUM BROMIDE 10 MG/ML (PF) SYRINGE
PREFILLED_SYRINGE | INTRAVENOUS | Status: DC | PRN
  Administered 2019-04-18: 15 mg via INTRAVENOUS
  Administered 2019-04-18: 40 mg via INTRAVENOUS

## 2019-04-18 MED ORDER — FENTANYL CITRATE (PF) 100 MCG/2ML IJ SOLN
INTRAMUSCULAR | Status: DC | PRN
  Administered 2019-04-18 (×2): 50 ug via INTRAVENOUS

## 2019-04-18 MED ORDER — BUPIVACAINE HCL (PF) 0.25 % IJ SOLN
INTRAMUSCULAR | Status: DC | PRN
  Administered 2019-04-18: 20 mL

## 2019-04-18 SURGICAL SUPPLY — 17 items
BLANKET WARM UNDERBOD FULL ACC (MISCELLANEOUS) ×3 IMPLANT
CATH MAPPNG PENTARAY F 2-6-2MM (CATHETERS) IMPLANT
CATH SMTCH THERMOCOOL SF DF (CATHETERS) ×2 IMPLANT
CATH SOUNDSTAR 3D IMAGING (CATHETERS) ×2 IMPLANT
CATH WEBSTER BI DIR CS D-F CRV (CATHETERS) ×2 IMPLANT
COVER SWIFTLINK CONNECTOR (BAG) ×3 IMPLANT
NDL BAYLIS TRANSSEPTAL 71CM (NEEDLE) IMPLANT
NEEDLE BAYLIS TRANSSEPTAL 71CM (NEEDLE) ×3 IMPLANT
PACK EP LATEX FREE (CUSTOM PROCEDURE TRAY) ×3
PACK EP LF (CUSTOM PROCEDURE TRAY) ×1 IMPLANT
PAD PRO RADIOLUCENT 2001M-C (PAD) ×3 IMPLANT
PATCH CARTO3 (PAD) ×2 IMPLANT
PENTARAY F 2-6-2MM (CATHETERS) ×3
SHEATH AVANTI 11F 11CM (SHEATH) ×2 IMPLANT
SHEATH PINNACLE 7F 10CM (SHEATH) ×4 IMPLANT
SHEATH SWARTZ TS SL2 63CM 8.5F (SHEATH) ×2 IMPLANT
TUBING SMART ABLATE COOLFLOW (TUBING) ×2 IMPLANT

## 2019-04-18 NOTE — Transfer of Care (Signed)
Immediate Anesthesia Transfer of Care Note  Patient: Cathy Montoya  Procedure(s) Performed: ATRIAL FIBRILLATION ABLATION (N/A )  Patient Location: PACU  Anesthesia Type:General  Level of Consciousness: awake, alert , oriented and patient cooperative  Airway & Oxygen Therapy: Patient Spontanous Breathing and Patient connected to face mask oxygen  Post-op Assessment: Report given to RN and Post -op Vital signs reviewed and stable  Post vital signs: Reviewed and stable  Last Vitals:  Vitals Value Taken Time  BP 111/69 04/18/19 1437  Temp 36.3 C 04/18/19 1435  Pulse 50 04/18/19 1440  Resp 15 04/18/19 1440  SpO2 97 % 04/18/19 1440  Vitals shown include unvalidated device data.  Last Pain:  Vitals:   04/18/19 1435  TempSrc: Temporal  PainSc: 0-No pain         Complications: No apparent anesthesia complications

## 2019-04-18 NOTE — Discharge Instructions (Signed)
Cardiac Ablation, Care After This sheet gives you information about how to care for yourself after your procedure. Your health care provider may also give you more specific instructions. If you have problems or questions, contact your health care provider. What can I expect after the procedure? After the procedure, it is common to have:  Bruising around your puncture site.  Tenderness around your puncture site.  Skipped heartbeats.  Tiredness (fatigue). Follow these instructions at home: Puncture site care   Follow instructions from your health care provider about how to take care of your puncture site. Make sure you: ? Wash your hands with soap and water before you change your bandage (dressing). If soap and water are not available, use hand sanitizer. ? Change your dressing as told by your health care provider. ? Leave stitches (sutures), skin glue, or adhesive strips in place. These skin closures may need to stay in place for up to 2 weeks. If adhesive strip edges start to loosen and curl up, you may trim the loose edges. Do not remove adhesive strips completely unless your health care provider tells you to do that.  Check your puncture site every day for signs of infection. Check for: ? Redness, swelling, or pain. ? Fluid or blood. If your puncture site starts to bleed, lie down on your back, apply firm pressure to the area, and contact your health care provider. ? Warmth. ? Pus or a bad smell. Driving  Ask your health care provider when it is safe for you to drive again after the procedure.  Do not drive or use heavy machinery while taking prescription pain medicine.  Do not drive for 24 hours if you were given a medicine to help you relax (sedative) during your procedure. Activity  Avoid activities that take a lot of effort for at least 3 days after your procedure.  Do not lift anything that is heavier than 10 lb (4.5 kg), or the limit that you are told, until your health  care provider says that it is safe.  Return to your normal activities as told by your health care provider. Ask your health care provider what activities are safe for you. General instructions  Take over-the-counter and prescription medicines only as told by your health care provider.  Do not use any products that contain nicotine or tobacco, such as cigarettes and e-cigarettes. If you need help quitting, ask your health care provider.  Do not take baths, swim, or use a hot tub until your health care provider approves.  Do not drink alcohol for 24 hours after your procedure.  Keep all follow-up visits as told by your health care provider. This is important. Contact a health care provider if:  You have redness, mild swelling, or pain around your puncture site.  You have fluid or blood coming from your puncture site that stops after applying firm pressure to the area.  Your puncture site feels warm to the touch.  You have pus or a bad smell coming from your puncture site.  You have a fever.  You have chest pain or discomfort that spreads to your neck, jaw, or arm.  You are sweating a lot.  You feel nauseous.  You have a fast or irregular heartbeat.  You have shortness of breath.  You are dizzy or light-headed and feel the need to lie down.  You have pain or numbness in the arm or leg closest to your puncture site. Get help right away if:  Your puncture  site suddenly swells. °· Your puncture site is bleeding and the bleeding does not stop after applying firm pressure to the area. °These symptoms may represent a serious problem that is an emergency. Do not wait to see if the symptoms will go away. Get medical help right away. Call your local emergency services (911 in the [place] Do not drive yourself to the hospital. °Summary °· After the procedure, it is normal to have bruising and tenderness at the puncture site in your groin, neck, or forearm. °· Check your puncture site every  day for signs of infection. °· Get help right away if your puncture site is bleeding and the bleeding does not stop after applying firm pressure to the area. This is a medical emergency. °This information is not intended to replace advice given to you by your health care provider. Make sure you discuss any questions you have with your health care provider. °Document Released: 11/12/2016 Document Revised: 07/16/2017 Document Reviewed: 11/12/2016 °Elsevier Patient Education © 2020 [employer] ° °

## 2019-04-18 NOTE — Anesthesia Preprocedure Evaluation (Addendum)
Anesthesia Evaluation  Patient identified by MRN, date of birth, ID band Patient awake    Reviewed: Allergy & Precautions, NPO status , Patient's Chart, lab work & pertinent test results  Airway Mallampati: II  TM Distance: >3 FB     Dental   Pulmonary    breath sounds clear to auscultation       Cardiovascular + dysrhythmias Atrial Fibrillation  Rhythm:Regular Rate:Normal  History noted. CG   Neuro/Psych    GI/Hepatic negative GI ROS, Neg liver ROS,   Endo/Other  negative endocrine ROS  Renal/GU negative Renal ROS     Musculoskeletal   Abdominal   Peds  Hematology   Anesthesia Other Findings   Reproductive/Obstetrics                           Anesthesia Physical Anesthesia Plan  ASA: III  Anesthesia Plan: General   Post-op Pain Management:    Induction: Intravenous  PONV Risk Score and Plan: Dexamethasone and Midazolam  Airway Management Planned: Oral ETT  Additional Equipment:   Intra-op Plan:   Post-operative Plan:   Informed Consent: I have reviewed the patients History and Physical, chart, labs and discussed the procedure including the risks, benefits and alternatives for the proposed anesthesia with the patient or authorized representative who has indicated his/her understanding and acceptance.     Dental advisory given  Plan Discussed with: CRNA and Anesthesiologist  Anesthesia Plan Comments:         Anesthesia Quick Evaluation

## 2019-04-18 NOTE — Interval H&P Note (Signed)
History and Physical Interval Note:  04/18/2019 11:08 AM  Cathy Montoya  has presented today for surgery, with the diagnosis of Atrial fibrillation.  The various methods of treatment have been discussed with the patient and family. After consideration of risks, benefits and other options for treatment, the patient has consented to  Procedure(s): ATRIAL FIBRILLATION ABLATION (N/A) as well as atrial flutter ablation as a surgical intervention.  The patient's history has been reviewed, patient examined, no change in status, stable for surgery.  I have reviewed the patient's chart and labs.  Questions were answered to the patient's satisfaction.    Cardiac CT reviewed today.  She reports compliance with eliquis without interruption.   Thompson Grayer

## 2019-04-18 NOTE — Progress Notes (Signed)
No bleeding or swelling noted after ambulation 

## 2019-04-18 NOTE — Progress Notes (Signed)
Site area: rt groin 3-way stop cock and suture removed Site Prior to Removal:  Level 0 Pressure Applied For: 5 minutes Manual:   yes Patient Status During Pull:  stable Post Pull Site:  Level 0 Post Pull Instructions Given:  yes Post Pull Pulses Present: rt dp palpable Dressing Applied:  Gauze and tegaderm Bedrest begins @ 1410 Comments: IV saline locked

## 2019-04-18 NOTE — Anesthesia Procedure Notes (Signed)
Procedure Name: Intubation Date/Time: 04/18/2019 11:29 AM Performed by: Cleda Daub, CRNA Pre-anesthesia Checklist: Patient identified, Emergency Drugs available, Suction available and Patient being monitored Patient Re-evaluated:Patient Re-evaluated prior to induction Oxygen Delivery Method: Circle system utilized Preoxygenation: Pre-oxygenation with 100% oxygen Induction Type: IV induction Laryngoscope Size: Glidescope and 3 Grade View: Grade I Tube size: 7.0 mm Number of attempts: 1 Airway Equipment and Method: Stylet and Video-laryngoscopy Placement Confirmation: positive ETCO2 and breath sounds checked- equal and bilateral Secured at: 22 cm Tube secured with: Tape Dental Injury: Teeth and Oropharynx as per pre-operative assessment

## 2019-04-19 ENCOUNTER — Encounter (HOSPITAL_COMMUNITY): Payer: Self-pay | Admitting: Internal Medicine

## 2019-04-19 NOTE — Anesthesia Postprocedure Evaluation (Signed)
Anesthesia Post Note  Patient: Cathy Montoya  Procedure(s) Performed: ATRIAL FIBRILLATION ABLATION (N/A )     Patient location during evaluation: PACU Anesthesia Type: General Level of consciousness: awake Pain management: pain level controlled Vital Signs Assessment: post-procedure vital signs reviewed and stable Respiratory status: spontaneous breathing Cardiovascular status: stable Postop Assessment: no apparent nausea or vomiting Anesthetic complications: no    Last Vitals:  Vitals:   04/18/19 1830 04/18/19 1900  BP: (!) 161/65 (!) 165/70  Pulse: 67 69  Resp: 12 13  Temp:    SpO2: 93% 95%    Last Pain:  Vitals:   04/19/19 0922  TempSrc:   PainSc: 0-No pain                 Noraa Pickeral

## 2019-05-05 ENCOUNTER — Ambulatory Visit
Admission: RE | Admit: 2019-05-05 | Discharge: 2019-05-05 | Disposition: A | Discharge status: Home / Self Care | Payer: Medicare Other | Source: Ambulatory Visit | Attending: Nurse Practitioner | Admitting: Nurse Practitioner

## 2019-05-05 ENCOUNTER — Other Ambulatory Visit: Payer: Self-pay

## 2019-05-05 DIAGNOSIS — Z1231 Encounter for screening mammogram for malignant neoplasm of breast: Secondary | ICD-10-CM

## 2019-05-17 ENCOUNTER — Encounter (HOSPITAL_COMMUNITY): Payer: Self-pay | Admitting: Physician Assistant

## 2019-05-17 ENCOUNTER — Other Ambulatory Visit: Payer: Self-pay

## 2019-05-17 ENCOUNTER — Ambulatory Visit (HOSPITAL_COMMUNITY)
Admission: RE | Admit: 2019-05-17 | Discharge: 2019-05-17 | Disposition: A | Discharge status: Home / Self Care | Payer: Medicare Other | Source: Ambulatory Visit | Attending: Physician Assistant | Admitting: Physician Assistant

## 2019-05-17 VITALS — BP 152/70 | HR 54 | Ht 68.5 in | Wt 156.6 lb

## 2019-05-17 DIAGNOSIS — Z803 Family history of malignant neoplasm of breast: Secondary | ICD-10-CM | POA: Diagnosis not present

## 2019-05-17 DIAGNOSIS — Z853 Personal history of malignant neoplasm of breast: Secondary | ICD-10-CM | POA: Insufficient documentation

## 2019-05-17 DIAGNOSIS — I4892 Unspecified atrial flutter: Secondary | ICD-10-CM | POA: Insufficient documentation

## 2019-05-17 DIAGNOSIS — Z881 Allergy status to other antibiotic agents status: Secondary | ICD-10-CM | POA: Insufficient documentation

## 2019-05-17 DIAGNOSIS — Z7901 Long term (current) use of anticoagulants: Secondary | ICD-10-CM | POA: Insufficient documentation

## 2019-05-17 DIAGNOSIS — I491 Atrial premature depolarization: Secondary | ICD-10-CM | POA: Insufficient documentation

## 2019-05-17 DIAGNOSIS — I480 Paroxysmal atrial fibrillation: Secondary | ICD-10-CM

## 2019-05-17 DIAGNOSIS — Z8249 Family history of ischemic heart disease and other diseases of the circulatory system: Secondary | ICD-10-CM | POA: Insufficient documentation

## 2019-05-17 DIAGNOSIS — Z809 Family history of malignant neoplasm, unspecified: Secondary | ICD-10-CM | POA: Diagnosis not present

## 2019-05-17 DIAGNOSIS — Z8042 Family history of malignant neoplasm of prostate: Secondary | ICD-10-CM | POA: Diagnosis not present

## 2019-05-17 DIAGNOSIS — Z79899 Other long term (current) drug therapy: Secondary | ICD-10-CM | POA: Insufficient documentation

## 2019-05-17 MED ORDER — DILTIAZEM HCL ER COATED BEADS 120 MG PO CP24: 120.0000 mg | ORAL_CAPSULE | Freq: Every day | ORAL | 3 refills | Status: DC

## 2019-05-17 NOTE — Progress Notes (Addendum)
Primary Care Physician: Harlan Stains, MD Referring Physician: Dr. Lynwood Dawley Cathy Montoya is a 73 y.o. female with a h/o atrial flutter ablation, 04/2018.  A flutter could not be induced, and per Dr. Jackalyn Lombard note after the procedure, afib was induced at the end of the procedure. She has 30 mg Cardizem which she takes for breakthrough afib. She noted more afib in March of 2020 after kidney surgery for stone extraction. She still has breakthrough afib 1-2 a month lasting 4-8 hours at a time. Usually responds to 30 mg cardizem. She had a DCCV arranged but spontaneously converted to SR prior to the procedure.   On follow up today, patient is s/p afib and flutter ablation with Dr Rayann Heman 04/18/19. She reports that she had done reasonably well. She did have two brief episodes soon after her ablation which resolved after about 15 minutes. She denies CP, swallowing, or groin issues.   Today, she denies symptoms of chest pain, shortness of breath, orthopnea, PND, lower extremity edema, dizziness, presyncope, syncope, or neurologic sequela. The patient is tolerating medications without difficulties and is otherwise without complaint today.   Past Medical History:  Diagnosis Date  . Abnormal Pap smear   . Acne rosacea   . Alopecia    frontal  . Arthritis    knees, lt  hip, lower back, hands, shoulders  . Breast cancer, right ([place]) 04/11/2004   invasive lobular carcinoma treated with 1 chemo treatment and radiation  . Cat allergies   . History of kidney stones   . Intermittent vertigo    FRONTAL FIBROSIS ALOPECIA  . Lipoma   . Paroxysmal atrial fibrillation (HCC)   . Shoulder pain, left   . Typical atrial flutter [facility])    Past Surgical History:  Procedure Laterality Date  . A-FLUTTER ABLATION N/A 04/21/2018   Procedure: A-FLUTTER ABLATION;  Surgeon: Thompson Grayer, MD;  Location: [place] [facility];  Service: Cardiovascular;  Laterality: N/A;  . ATRIAL FIBRILLATION ABLATION N/A 04/18/2019   Procedure: ATRIAL FIBRILLATION ABLATION;  Surgeon: Thompson Grayer, MD;  Location: [place] [facility];  Service: Cardiovascular;  Laterality: N/A;  . BREAST LUMPECTOMY  01/10/2004   lymph node, chemo x 1/radiation  . COLONOSCOPY    . CYSTOSCOPY/URETEROSCOPY/HOLMIUM LASER/STENT PLACEMENT Bilateral 10/21/2018   Procedure: CYSTOSCOPY/BILATERAL RETROGRADE PYELOGRAM / BILATERAL URETEROSCOPY/ BILATERAL STENT PLACEMENT/BILATERAL STONE BASKETING;  Surgeon: Lucas Mallow, MD;  Location: [facility];  Service: Urology;  Laterality: Bilateral;  . GYNECOLOGIC CRYOSURGERY  1988   cervical dysplasia    Current Outpatient Medications  Medication Sig Dispense Refill  . diltiazem (CARDIZEM CD) 120 MG 24 hr capsule Take 1 capsule (120 mg total) by mouth daily. 90 capsule 3  . diltiazem (CARDIZEM) 30 MG tablet Take 1-2 tablets (30-60 mg total) by mouth every 4 (four) hours as needed (High heart rates). (Patient taking differently: Take 30-60 mg by mouth every 4 (four) hours as needed (High heart rates). For high heart rates) 45 tablet 2  . Glucosamine-Chondroitin (COSAMIN DS PO) Take 1 tablet by mouth daily with supper.    . latanoprost (XALATAN) 0.005 % ophthalmic solution Place 1 drop into both eyes at bedtime.     . metoprolol tartrate (LOPRESSOR) 100 MG tablet Take 1 tablet (100 mg total) by mouth once for 1 dose. Take 1 tablet by mouth 2 hours prior to cardiac CT for pulse greater than 55 1 tablet 0  . metroNIDAZOLE (METROCREAM) 0.75 % cream Apply 1 application topically 2 (  two) times daily as needed (rosacea.).     Marland Kitchen Multiple Vitamin (MULTIVITAMIN WITH MINERALS) TABS tablet Take 1 tablet by mouth daily with supper. Centrum Silver for Women    . pantoprazole (PROTONIX) 40 MG tablet Take 1 tablet (40 mg total) by mouth daily. 45 tablet 0  . timolol (TIMOPTIC) 0.5 % ophthalmic solution Place 1 drop into both eyes 2 (two) times daily.  24  . XARELTO 20 MG TABS tablet TAKE 1 TABLET BY MOUTH DAILY WITH SUPPER. (Patient  taking differently: Take 20 mg by mouth daily with supper. ) 30 tablet 3   No current facility-administered medications for this visit.     Allergies  Allergen Reactions  . Amoxicillin Rash    Did it involve swelling of the face/tongue/throat, SOB, or low BP? No Did it involve sudden or severe rash/hives, skin peeling, or any reaction on the inside of your mouth or nose? No Did you need to seek medical attention at a hospital or doctor's office? No When did it last happen?2-3 years ago If all above answers are "NO", may proceed with cephalosporin use.     Social History   Socioeconomic History  . Marital status: Married    Spouse name: Not on file  . Number of children: 2  . Years of education: Not on file  . Highest education level: Not on file  Occupational History  . Occupation: Firefighter  Social Needs  . Financial resource strain: Not on file  . Food insecurity    Worry: Not on file    Inability: Not on file  . Transportation needs    Medical: Not on file    Non-medical: Not on file  Tobacco Use  . Smoking status: Never Smoker  . Smokeless tobacco: Never Used  Substance and Sexual Activity  . Alcohol use: Yes    Comment: wine-occ  . Drug use: No  . Sexual activity: Yes    Partners: Male    Birth control/protection: Post-menopausal  Lifestyle  . Physical activity    Days per week: Not on file    Minutes per session: Not on file  . Stress: Not on file  Relationships  . Social Herbalist on phone: Not on file    Gets together: Not on file    Attends religious service: Not on file    Active member of club or organization: Not on file    Attends meetings of clubs or organizations: Not on file    Relationship status: Not on file  . Intimate partner violence    Fear of current or ex partner: Not on file    Emotionally abused: Not on file    Physically abused: Not on file    Forced sexual activity: Not on file  Other Topics Concern  . Not  on file  Social History Narrative  . Not on file    Family History  Problem Relation Age of Onset  . Hypertension Father   . Cancer Father        PROSTATE  . Thyroid disease Mother   . Alzheimer's disease Mother   . Breast cancer Sister 63       mental retardation  . Breast cancer Paternal Grandmother   . Cancer Maternal Grandmother        unknown type  . Breast cancer Maternal Grandmother   . Breast cancer Other        aunt  . Cancer Other  aunt, unknown type-had hysterectomy  . Thyroid disease Daughter        cyst on thyroid-removed partial thyroid  . Other Maternal Grandfather        TRAIN WRECK  . Colon cancer Neg Hx     ROS- All systems are reviewed and negative except as per the HPI above  Physical Exam: There were no vitals filed for this visit. Wt Readings from Last 3 Encounters:  04/18/19 155 lb (70.3 kg)  04/12/19 156 lb (70.8 kg)  03/22/19 157 lb 3.2 oz (71.3 kg)    Labs: Lab Results  Component Value Date   NA 139 03/22/2019   K 4.6 03/22/2019   CL 105 03/22/2019   CO2 27 03/22/2019   GLUCOSE 128 (H) 03/22/2019   BUN 21 03/22/2019   CREATININE 0.94 03/22/2019   CALCIUM 9.8 03/22/2019   No results found for: INR No results found for: CHOL, HDL, LDLCALC, TRIG  GEN- The patient is well appearing, alert and oriented x 3 today.   HEENT-head normocephalic, atraumatic, sclera clear, conjunctiva pink, hearing intact, trachea midline. Lungs- Clear to ausculation bilaterally, normal work of breathing Heart- Regular rate and rhythm, no murmurs, rubs or gallops  GI- soft, NT, ND, + BS Extremities- no clubbing, cyanosis, or edema MS- no significant deformity or atrophy Skin- no rash or lesion Psych- euthymic mood, full affect Neuro- strength and sensation are intact   EKG- SB HR 54, incRBBB, PAC, PR 128, QRS 90, QTc 419  Epic records reviewed   Assessment and Plan: 1. Typical aflutter/Paroxysmal afib S/p flutter ablation 04/2018 with afib  noted after procedure.  She is now s/p afib and flutter ablation with Dr Rayann Heman 04/18/19. Patient appears to be maintaining SR. Continue Xarelto 20 mg for at least 3 months post ablation with no missed doses. Continue diltiazem 120 mg daily for now with 30 mg PRN q4hrs for heart racing.  This patients CHA2DS2-VASc Score and unadjusted Ischemic Stroke Rate (% per year) is equal to 2.2 % stroke rate/year from a score of 2  Above score calculated as 1 point each if present [CHF, HTN, DM, Vascular=MI/PAD/Aortic Plaque, Age if 65-74, or Female] Above score calculated as 2 points each if present [Age > 75, or Stroke/TIA/TE]    Follow up with Dr Rayann Heman as scheduled.    [facility] [address] [place], [place] [address] [phone]

## 2019-05-27 ENCOUNTER — Other Ambulatory Visit: Payer: Self-pay | Admitting: Internal Medicine

## 2019-06-05 ENCOUNTER — Other Ambulatory Visit (HOSPITAL_COMMUNITY): Payer: Self-pay | Admitting: Nurse Practitioner

## 2019-06-05 NOTE — Telephone Encounter (Signed)
Xarelto 20mg  refill request received. Pt is 73 years old, weight-71kg, Crea-0.94 on 03/22/2019, last seen by Malka So on 05/17/2019, Diagnosis-Afib, CrCl-60.32ml/min; Dose is appropriate based on dosing criteria. Will send in refill to requested pharmacy.

## 2019-07-19 ENCOUNTER — Telehealth (INDEPENDENT_AMBULATORY_CARE_PROVIDER_SITE_OTHER): Payer: Medicare Other | Admitting: Internal Medicine

## 2019-07-19 ENCOUNTER — Encounter: Payer: Self-pay | Admitting: Internal Medicine

## 2019-07-19 VITALS — HR 61 | Ht 68.5 in | Wt 154.0 lb

## 2019-07-19 DIAGNOSIS — I483 Typical atrial flutter: Secondary | ICD-10-CM

## 2019-07-19 DIAGNOSIS — I480 Paroxysmal atrial fibrillation: Secondary | ICD-10-CM

## 2019-07-19 NOTE — Progress Notes (Signed)
Electrophysiology TeleHealth Note   Due to national recommendations of social distancing due to COVID 19, an audio/video telehealth visit is felt to be most appropriate for this patient at this time.  See MyChart message from today for the patient's consent to telehealth for [facility].    Date:  07/19/2019   ID:  Cathy Montoya, DOB June 12, 1946, MRN [id number]  Location: patient's home  Provider location:  [facility]  Evaluation Performed: Follow-up visit  PCP:  Harlan Stains, MD   Electrophysiologist:  Dr Rayann Heman  Chief Complaint:  AF follow up  History of Present Illness:    Cathy Montoya is a 73 y.o. female who presents via telehealth conferencing today.  Since last being seen in our clinic, the patient reports doing very well. She has had no procedure related complications.  Today, she denies symptoms of palpitations, chest pain, shortness of breath,  lower extremity edema, dizziness, presyncope, or syncope.  The patient is otherwise without complaint today.  The patient denies symptoms of fevers, chills, cough, or new SOB worrisome for COVID 19.  Past Medical History:  Diagnosis Date  . Abnormal Pap smear   . Acne rosacea   . Alopecia    frontal  . Arthritis    knees, lt  hip, lower back, hands, shoulders  . Breast cancer, right ([place]) 04/11/2004   invasive lobular carcinoma treated with 1 chemo treatment and radiation  . Cat allergies   . History of kidney stones   . Intermittent vertigo    FRONTAL FIBROSIS ALOPECIA  . Lipoma   . Paroxysmal atrial fibrillation (HCC)   . Shoulder pain, left   . Typical atrial flutter [facility])     Past Surgical History:  Procedure Laterality Date  . A-FLUTTER ABLATION N/A 04/21/2018   Procedure: A-FLUTTER ABLATION;  Surgeon: Thompson Grayer, MD;  Location: [place] [facility];  Service: Cardiovascular;  Laterality: N/A;  . ATRIAL FIBRILLATION ABLATION N/A 04/18/2019   Procedure: ATRIAL FIBRILLATION ABLATION;  Surgeon:  Thompson Grayer, MD;  Location: [place] [facility];  Service: Cardiovascular;  Laterality: N/A;  . BREAST LUMPECTOMY  01/10/2004   lymph node, chemo x 1/radiation  . COLONOSCOPY    . CYSTOSCOPY/URETEROSCOPY/HOLMIUM LASER/STENT PLACEMENT Bilateral 10/21/2018   Procedure: CYSTOSCOPY/BILATERAL RETROGRADE PYELOGRAM / BILATERAL URETEROSCOPY/ BILATERAL STENT PLACEMENT/BILATERAL STONE BASKETING;  Surgeon: Lucas Mallow, MD;  [facility];  Service: Urology;  Laterality: Bilateral;  . GYNECOLOGIC CRYOSURGERY  1988   cervical dysplasia    Current Outpatient Medications  Medication Sig Dispense Refill  . diltiazem (CARDIZEM CD) 120 MG 24 hr capsule Take 1 capsule (120 mg total) by mouth daily. 90 capsule 3  . diltiazem (CARDIZEM) 30 MG tablet Take 1-2 tablets (30-60 mg total) by mouth every 4 (four) hours as needed (High heart rates). (Patient taking differently: Take 30-60 mg by mouth every 4 (four) hours as needed (High heart rates). For high heart rates) 45 tablet 2  . Glucosamine-Chondroitin (COSAMIN DS PO) Take 1 tablet by mouth daily with supper.    . latanoprost (XALATAN) 0.005 % ophthalmic solution Place 1 drop into both eyes at bedtime.     . metroNIDAZOLE (METROCREAM) 0.75 % cream Apply 1 application topically 2 (two) times daily as needed (rosacea.).     Marland Kitchen Multiple Vitamin (MULTIVITAMIN WITH MINERALS) TABS tablet Take 1 tablet by mouth daily with supper. Centrum Silver for Women    . timolol (TIMOPTIC) 0.5 % ophthalmic solution Place 1 drop into both  eyes 2 (two) times daily.  24  . XARELTO 20 MG TABS tablet TAKE 1 TABLET BY MOUTH DAILY WITH SUPPER. 30 tablet 9  . metoprolol tartrate (LOPRESSOR) 100 MG tablet Take 1 tablet (100 mg total) by mouth once for 1 dose. Take 1 tablet by mouth 2 hours prior to cardiac CT for pulse greater than 55 1 tablet 0   No current facility-administered medications for this visit.     Allergies:   Amoxicillin   Social History:  The patient  reports that  she has never smoked. She has never used smokeless tobacco. She reports current alcohol use. She reports that she does not use drugs.   Family History:  The patient's family history includes Alzheimer's disease in her mother; Breast cancer in her maternal grandmother, paternal grandmother, and another family member; Breast cancer (age of onset: 23) in her sister; Cancer in her father, maternal grandmother, and another family member; Hypertension in her father; Other in her maternal grandfather; Thyroid disease in her daughter and mother.   ROS:  Please see the history of present illness.   All other systems are personally reviewed and negative.    Exam:    Vital Signs:  Pulse 61   Ht 5' 8.5" (1.74 m)   Wt 154 lb (69.9 kg)   LMP 08/17/1998   SpO2 96%   BMI 23.08 kg/m   Well sounding and appearing, alert and conversant, regular work of breathing,  good skin color Eyes- anicteric, neuro- grossly intact, skin- no apparent rash or lesions or cyanosis, mouth- oral mucosa is pink  Labs/Other Tests and Data Reviewed:    Recent Labs: 03/22/2019: BUN 21; Creatinine, Ser 0.94; Hemoglobin 14.6; Platelets 188; Potassium 4.6; Sodium 139; TSH 1.575   Wt Readings from Last 3 Encounters:  07/19/19 154 lb (69.9 kg)  05/17/19 156 lb 9.6 oz (71 kg)  04/18/19 155 lb (70.3 kg)      ASSESSMENT & PLAN:    1.  Paroxysmal atrial fibrillation/typical atrial flutter No recurrence post ablation No procedure related complications Continue Xarelto for CHADS2VASC of 2   Follow-up:  With me in 3 months    Patient Risk:  after full review of this patients clinical status, I feel that they are at moderate risk at this time.  Today, I have spent 15 minutes with the patient with telehealth technology discussing arrhythmia management .    Army Fossa, MD  07/19/2019 10:49 AM     [facility] [address] [place]  [address] [phone] (office) [phone] (fax)

## 2019-08-23 HISTORY — PX: WRIST SURGERY: SHX841

## 2019-09-06 ENCOUNTER — Ambulatory Visit (HOSPITAL_COMMUNITY): Payer: Medicare Other | Admitting: Nurse Practitioner

## 2019-09-27 ENCOUNTER — Other Ambulatory Visit: Payer: Self-pay

## 2019-09-29 ENCOUNTER — Other Ambulatory Visit: Payer: Self-pay

## 2019-09-29 ENCOUNTER — Other Ambulatory Visit (HOSPITAL_COMMUNITY)
Admission: RE | Admit: 2019-09-29 | Discharge: 2019-09-29 | Disposition: A | Discharge status: Home / Self Care | Payer: Medicare PPO | Source: Ambulatory Visit | Attending: Obstetrics & Gynecology | Admitting: Obstetrics & Gynecology

## 2019-09-29 ENCOUNTER — Encounter: Payer: Self-pay | Admitting: Obstetrics & Gynecology

## 2019-09-29 ENCOUNTER — Ambulatory Visit (INDEPENDENT_AMBULATORY_CARE_PROVIDER_SITE_OTHER): Payer: Medicare PPO | Admitting: Obstetrics & Gynecology

## 2019-09-29 VITALS — BP 128/70 | HR 68 | Temp 97.2°F | Resp 10 | Ht 68.5 in | Wt 159.0 lb

## 2019-09-29 DIAGNOSIS — Z01419 Encounter for gynecological examination (general) (routine) without abnormal findings: Secondary | ICD-10-CM | POA: Diagnosis not present

## 2019-09-29 DIAGNOSIS — Z124 Encounter for screening for malignant neoplasm of cervix: Secondary | ICD-10-CM | POA: Diagnosis present

## 2019-09-29 NOTE — Progress Notes (Signed)
74 y.o. G2P2 Married White or Caucasian female here for annual exam.  Had cardiac ablation for a second time in 04/2019 due to afib.  The second ablation seemed to have worked.  She stopped the diltiazem about a month.  Still on Xalrelto.    Also, had wrist fracture that needed surgical repair.  Still having follow up with him.    Denies vaginal bleeding.    Has questions about extended genetic testing today.  Some of this is coming from her daughter's provider.  Pt has negative genetic testing with BRCA years ago.  Pt really does not want to do this.  Asks about possibility of daughter having this done.  Her daughter lives out of state and I do not have the ability to assess her actual risk  Health Maintenance: Pap:  01/01/17 Neg History of abnormal Pap:  Yes, years ago MMG:  05/05/2019 negative Colonoscopy:  01/16/16 Normal. No repeat due to age BMD:   04/08/17 -1.3, left femur  TDaP: 05/18/2011 Pneumonia vaccine(s):  Completed x2 Zostavax:  Completed Hep C testing: has done with PCP Labs: PCP  Patient's last menstrual period was 08/17/1998.          Sexually active: Yes.     reports that she has never smoked. She has never used smokeless tobacco. She reports current alcohol use. She reports that she does not use drugs.  Past Medical History:  Diagnosis Date  . Abnormal Pap smear   . Acne rosacea   . Alopecia    frontal  . Arthritis    knees, lt  hip, lower back, hands, shoulders  . Breast cancer, right ([place]) 04/11/2004   invasive lobular carcinoma treated with 1 chemo treatment and radiation  . Cat allergies   . History of kidney stones   . Intermittent vertigo    FRONTAL FIBROSIS ALOPECIA  . Lipoma   . Paroxysmal atrial fibrillation (HCC)   . Shoulder pain, left   . Typical atrial flutter [facility])     Past Surgical History:  Procedure Laterality Date  . A-FLUTTER ABLATION N/A 04/21/2018   Procedure: A-FLUTTER ABLATION;  Surgeon: Thompson Grayer, MD;  Location: [place] [facility];   Service: Cardiovascular;  Laterality: N/A;  . ATRIAL FIBRILLATION ABLATION N/A 04/18/2019   Procedure: ATRIAL FIBRILLATION ABLATION;  Surgeon: Thompson Grayer, MD;  Location: [place] [facility];  Service: Cardiovascular;  Laterality: N/A;  . BREAST LUMPECTOMY  01/10/2004   lymph node, chemo x 1/radiation  . COLONOSCOPY    . CYSTOSCOPY/URETEROSCOPY/HOLMIUM LASER/STENT PLACEMENT Bilateral 10/21/2018   Procedure: CYSTOSCOPY/BILATERAL RETROGRADE PYELOGRAM / BILATERAL URETEROSCOPY/ BILATERAL STENT PLACEMENT/BILATERAL STONE BASKETING;  Surgeon: Lucas Mallow, MD;  [facility];  Service: Urology;  Laterality: Bilateral;  . GYNECOLOGIC CRYOSURGERY  1988   cervical dysplasia  . WRIST SURGERY Left 08/23/2019    Current Outpatient Medications  Medication Sig Dispense Refill  . Cholecalciferol (VITAMIN D3) 75 MCG (3000 UT) TABS Take by mouth.    . diltiazem (CARDIZEM CD) 120 MG 24 hr capsule Take 1 capsule (120 mg total) by mouth daily. 90 capsule 3  . diltiazem (CARDIZEM) 30 MG tablet Take 1-2 tablets (30-60 mg total) by mouth every 4 (four) hours as needed (High heart rates). (Patient taking differently: Take 30-60 mg by mouth every 4 (four) hours as needed (High heart rates). For high heart rates) 45 tablet 2  . Glucosamine-Chondroitin (COSAMIN DS PO) Take 1 tablet by mouth daily with supper.    . latanoprost (XALATAN)  0.005 % ophthalmic solution Place 1 drop into both eyes at bedtime.     . metroNIDAZOLE (METROCREAM) 0.75 % cream Apply 1 application topically 2 (two) times daily as needed (rosacea.).     Marland Kitchen Multiple Vitamin (MULTIVITAMIN WITH MINERALS) TABS tablet Take 1 tablet by mouth daily with supper. Centrum Silver for Women    . timolol (TIMOPTIC) 0.5 % ophthalmic solution Place 1 drop into both eyes daily.   24  . XARELTO 20 MG TABS tablet TAKE 1 TABLET BY MOUTH DAILY WITH SUPPER. 30 tablet 9   No current facility-administered medications for this visit.    Family History  Problem  Relation Age of Onset  . Hypertension Father   . Cancer Father        PROSTATE  . Thyroid disease Mother   . Alzheimer's disease Mother   . Breast cancer Sister 78       mental retardation  . Breast cancer Paternal Grandmother   . Cancer Maternal Grandmother        unknown type  . Breast cancer Maternal Grandmother   . Breast cancer Other        aunt  . Cancer Other        aunt, unknown type-had hysterectomy  . Thyroid disease Daughter        cyst on thyroid-removed partial thyroid  . Other Maternal Grandfather        TRAIN WRECK  . Colon cancer Neg Hx     Review of Systems  All other systems reviewed and are negative.   Exam:   BP 128/70 (BP Location: Right Arm, Patient Position: Sitting, Cuff Size: Normal)   Pulse 68   Temp (!) 97.2 F (36.2 C) (Temporal)   Resp 10   Ht 5' 8.5" (1.74 m)   Wt 159 lb (72.1 kg)   LMP 08/17/1998   BMI 23.82 kg/m      Height: 5' 8.5" (174 cm)  Ht Readings from Last 3 Encounters:  09/29/19 5' 8.5" (1.74 m)  07/19/19 5' 8.5" (1.74 m)  05/17/19 5' 8.5" (1.74 m)    General appearance: alert, cooperative and appears stated age Head: Normocephalic, without obvious abnormality, atraumatic Neck: no adenopathy, supple, symmetrical, trachea midline and thyroid normal to inspection and palpation Lungs: clear to auscultation bilaterally Breasts: right breast with well healed scar and retraction, no change, no new masses, LAD; left breast with no masses, nipple discharge, LAD Heart: regular rate and rhythm Abdomen: soft, non-tender; bowel sounds normal; no masses,  no organomegaly Extremities: extremities normal, atraumatic, no cyanosis or edema Skin: Skin color, texture, turgor normal. No rashes or lesions Lymph nodes: Cervical, supraclavicular, and axillary nodes normal. No abnormal inguinal nodes palpated Neurologic: Grossly normal   Pelvic: External genitalia:  no lesions              Urethra:  normal appearing urethra with no masses,  tenderness or lesions              Bartholins and Skenes: normal                 Vagina: normal appearing vagina with significant atrophic changes noted, no masses or lesions              Cervix: no lesions              Pap taken: Yes.   Bimanual Exam:  Uterus:  normal size, contour, position, consistency, mobility, non-tender  Adnexa: normal adnexa and no mass, fullness, tenderness               Rectovaginal: Confirms               Anus:  normal sphincter tone, no lesions  Chaperone, Terence Lux, CMA, was present for exam.  A:  Well Woman with normal exam PMP, no HRT H/o breast cancer 4/09release from oncology 2015 Family hx of breast cancer in sister and PGM Afib, s/p cardiac ablation x 2, on Xarelto  P:   Mammogram guidelines reviewed.  Doing 3D. pap smear obtained today Lab work done with Dr. Dema Severin Consider repeat BMD 1-2 more years Return annually or prn

## 2019-10-01 ENCOUNTER — Encounter: Payer: Self-pay | Admitting: Obstetrics & Gynecology

## 2019-10-02 LAB — CYTOLOGY - PAP: Diagnosis: NEGATIVE

## 2019-10-23 ENCOUNTER — Telehealth (INDEPENDENT_AMBULATORY_CARE_PROVIDER_SITE_OTHER): Payer: Medicare PPO | Admitting: Internal Medicine

## 2019-10-23 ENCOUNTER — Encounter: Payer: Self-pay | Admitting: Internal Medicine

## 2019-10-23 VITALS — Ht 68.5 in | Wt 155.0 lb

## 2019-10-23 DIAGNOSIS — I480 Paroxysmal atrial fibrillation: Secondary | ICD-10-CM | POA: Diagnosis not present

## 2019-10-23 NOTE — Progress Notes (Signed)
Electrophysiology TeleHealth Note   Due to national recommendations of social distancing due to COVID 19, an audio/video telehealth visit is felt to be most appropriate for this patient at this time.  See MyChart message from today for the patient's consent to telehealth for [facility].  Date:  10/23/2019   ID:  Cathy Montoya, DOB 12/25/1945, MRN [id number]  Location: patient's home  Provider location:  [place]  Evaluation Performed: Follow-up visit  PCP:  Harlan Stains, MD   Electrophysiologist:  Dr Rayann Heman  Chief Complaint:  palpitations  History of Present Illness:    Cathy Montoya is a 74 y.o. female who presents via telehealth conferencing today.  Since last being seen in our clinic, the patient reports doing very well.  She broke her wrist in December and required surgery.  She has done well since.  No afib.  Today, she denies symptoms of palpitations, chest pain, shortness of breath,  lower extremity edema, dizziness, presyncope, or syncope.  The patient is otherwise without complaint today.   Past Medical History:  Diagnosis Date  . Abnormal Pap smear   . Acne rosacea   . Alopecia    frontal  . Arthritis    knees, lt  hip, lower back, hands, shoulders  . Breast cancer, right ([place]) 04/11/2004   invasive lobular carcinoma treated with 1 chemo treatment and radiation  . Cat allergies   . History of kidney stones   . Intermittent vertigo    FRONTAL FIBROSIS ALOPECIA  . Lipoma   . Paroxysmal atrial fibrillation (HCC)   . Shoulder pain, left   . Typical atrial flutter [facility])     Past Surgical History:  Procedure Laterality Date  . A-FLUTTER ABLATION N/A 04/21/2018   Procedure: A-FLUTTER ABLATION;  Surgeon: Thompson Grayer, MD;  Location: [place] CV [facility];  Service: Cardiovascular;  Laterality: N/A;  . ATRIAL FIBRILLATION ABLATION N/A 04/18/2019   Procedure: ATRIAL FIBRILLATION ABLATION;  Surgeon: Thompson Grayer, MD;  Location: [place] CV LAB;   Service: Cardiovascular;  Laterality: N/A;  . BREAST LUMPECTOMY  01/10/2004   lymph node, chemo x 1/radiation  . COLONOSCOPY    . CYSTOSCOPY/URETEROSCOPY/HOLMIUM LASER/STENT PLACEMENT Bilateral 10/21/2018   Procedure: CYSTOSCOPY/BILATERAL RETROGRADE PYELOGRAM / BILATERAL URETEROSCOPY/ BILATERAL STENT PLACEMENT/BILATERAL STONE BASKETING;  Surgeon: Lucas Mallow, MD;  [facility];  Service: Urology;  Laterality: Bilateral;  . GYNECOLOGIC CRYOSURGERY  1988   cervical dysplasia  . WRIST SURGERY Left 08/23/2019   Dr. Karren Cobble    Current Outpatient Medications  Medication Sig Dispense Refill  . Cholecalciferol (D3-1000) 25 MCG (1000 UT) tablet Take 1,000 Units by mouth daily.    Marland Kitchen diltiazem (CARDIZEM) 120 MG tablet Take 120 mg by mouth as needed (heartrate).     Marland Kitchen diltiazem (CARDIZEM) 30 MG tablet Take 1-2 tablets (30-60 mg total) by mouth every 4 (four) hours as needed (High heart rates). 45 tablet 2  . Glucosamine-Chondroitin (COSAMIN DS PO) Take 1 tablet by mouth daily with supper.    . latanoprost (XALATAN) 0.005 % ophthalmic solution Place 1 drop into both eyes at bedtime.     . metroNIDAZOLE (METROCREAM) 0.75 % cream Apply 1 application topically 2 (two) times daily as needed (rosacea.).     Marland Kitchen Multiple Vitamins-Minerals (CENTRUM SILVER ULTRA WOMENS PO) Take 1 tablet by mouth daily.    . timolol (TIMOPTIC) 0.5 % ophthalmic solution Place 1 drop into both eyes 2 (two) times daily.    Alveda Reasons 20  MG TABS tablet TAKE 1 TABLET BY MOUTH DAILY WITH SUPPER. 30 tablet 9   No current facility-administered medications for this visit.    Allergies:   Amoxicillin   Social History:  The patient  reports that she has never smoked. She has never used smokeless tobacco. She reports current alcohol use. She reports that she does not use drugs.   Family History:  The patient's  family history includes Alzheimer's disease in her mother; Breast cancer in her maternal grandmother, paternal  grandmother, and another family member; Breast cancer (age of onset: 69) in her sister; Cancer in her father, maternal grandmother, and another family member; Hypertension in her father; Other in her maternal grandfather; Thyroid disease in her daughter and mother.   ROS:  Please see the history of present illness.   All other systems are personally reviewed and negative.    Exam:    Vital Signs:  Ht 5' 8.5" (1.74 m)   Wt 155 lb (70.3 kg)   LMP 08/17/1998   BMI 23.22 kg/m   Well sounding and appearing, alert and conversant, regular work of breathing,  good skin color Eyes- anicteric, neuro- grossly intact, skin- no apparent rash or lesions or cyanosis, mouth- oral mucosa is pink  Labs/Other Tests and Data Reviewed:    Recent Labs: 03/22/2019: BUN 21; Creatinine, Ser 0.94; Hemoglobin 14.6; Platelets 188; Potassium 4.6; Sodium 139; TSH 1.575   Wt Readings from Last 3 Encounters:  10/23/19 155 lb (70.3 kg)  09/29/19 159 lb (72.1 kg)  07/19/19 154 lb (69.9 kg)    ASSESSMENT & PLAN:    1.  Paroxysmal atrial fibrillation/ atrial flutter Doing well post ablation off AAD therapy She has chads2vasc score of 2.  We discussed this at length today.  She is aware that update to guidelines would support her stopping xarelto.  She is very clear that she would prefer to continue xarelto at this time.  Follow-up:  6 months with me   Patient Risk:  after full review of this patients clinical status, I feel that they are at moderate risk at this time.  Today, I have spent 15 minutes with the patient with telehealth technology discussing arrhythmia management .    Army Fossa, MD  10/23/2019 10:19 AM     [facility] [address] [place] [address] [phone] (office) [phone] (fax)

## 2019-11-20 ENCOUNTER — Other Ambulatory Visit: Payer: Self-pay | Admitting: Family Medicine

## 2019-11-20 DIAGNOSIS — M8580 Other specified disorders of bone density and structure, unspecified site: Secondary | ICD-10-CM

## 2020-02-26 ENCOUNTER — Ambulatory Visit
Admission: RE | Admit: 2020-02-26 | Discharge: 2020-02-26 | Disposition: A | Discharge status: Home / Self Care | Payer: Medicare PPO | Source: Ambulatory Visit | Attending: Family Medicine | Admitting: Family Medicine

## 2020-02-26 ENCOUNTER — Other Ambulatory Visit: Payer: Self-pay

## 2020-02-26 DIAGNOSIS — Z780 Asymptomatic menopausal state: Secondary | ICD-10-CM | POA: Diagnosis not present

## 2020-02-26 DIAGNOSIS — M8580 Other specified disorders of bone density and structure, unspecified site: Secondary | ICD-10-CM

## 2020-02-26 DIAGNOSIS — M85851 Other specified disorders of bone density and structure, right thigh: Secondary | ICD-10-CM | POA: Diagnosis not present

## 2020-04-04 DIAGNOSIS — L723 Sebaceous cyst: Secondary | ICD-10-CM | POA: Diagnosis not present

## 2020-04-04 DIAGNOSIS — L821 Other seborrheic keratosis: Secondary | ICD-10-CM | POA: Diagnosis not present

## 2020-04-04 DIAGNOSIS — D1801 Hemangioma of skin and subcutaneous tissue: Secondary | ICD-10-CM | POA: Diagnosis not present

## 2020-04-04 DIAGNOSIS — L918 Other hypertrophic disorders of the skin: Secondary | ICD-10-CM | POA: Diagnosis not present

## 2020-04-04 DIAGNOSIS — Q828 Other specified congenital malformations of skin: Secondary | ICD-10-CM | POA: Diagnosis not present

## 2020-04-04 DIAGNOSIS — L814 Other melanin hyperpigmentation: Secondary | ICD-10-CM | POA: Diagnosis not present

## 2020-04-04 DIAGNOSIS — L661 Lichen planopilaris: Secondary | ICD-10-CM | POA: Diagnosis not present

## 2020-05-24 ENCOUNTER — Other Ambulatory Visit: Payer: Self-pay

## 2020-05-24 ENCOUNTER — Encounter: Payer: Self-pay | Admitting: Internal Medicine

## 2020-05-24 ENCOUNTER — Ambulatory Visit: Payer: Medicare PPO | Admitting: Internal Medicine

## 2020-05-24 VITALS — BP 138/82 | HR 58 | Ht 68.5 in | Wt 155.6 lb

## 2020-05-24 DIAGNOSIS — I480 Paroxysmal atrial fibrillation: Secondary | ICD-10-CM | POA: Diagnosis not present

## 2020-05-24 NOTE — Progress Notes (Signed)
PCP: Harlan Stains, MD   Primary EP: Dr Lynwood Dawley Cathy Montoya is a 74 y.o. female who presents today for routine electrophysiology followup.  Since last being seen in our clinic, the patient reports doing very well.  Today, she denies symptoms of palpitations, chest pain, shortness of breath,  lower extremity edema, dizziness, presyncope, or syncope.  The patient is otherwise without complaint today.   Past Medical History:  Diagnosis Date  . Abnormal Pap smear   . Acne rosacea   . Alopecia    frontal  . Arthritis    knees, lt  hip, lower back, hands, shoulders  . Breast cancer, right ([place]) 04/11/2004   invasive lobular carcinoma treated with 1 chemo treatment and radiation  . Cat allergies   . History of kidney stones   . Intermittent vertigo    FRONTAL FIBROSIS ALOPECIA  . Lipoma   . Paroxysmal atrial fibrillation (HCC)   . Shoulder pain, left   . Typical atrial flutter [facility])    Past Surgical History:  Procedure Laterality Date  . A-FLUTTER ABLATION N/A 04/21/2018   Procedure: A-FLUTTER ABLATION;  Surgeon: Thompson Grayer, MD;  Location: [place] CV LAB;  Service: Cardiovascular;  Laterality: N/A;  . ATRIAL FIBRILLATION ABLATION N/A 04/18/2019   Procedure: ATRIAL FIBRILLATION ABLATION;  Surgeon: Thompson Grayer, MD;  Location: [place] CV LAB;  Service: Cardiovascular;  Laterality: N/A;  . BREAST LUMPECTOMY  01/10/2004   lymph node, chemo x 1/radiation  . COLONOSCOPY    . CYSTOSCOPY/URETEROSCOPY/HOLMIUM LASER/STENT PLACEMENT Bilateral 10/21/2018   Procedure: CYSTOSCOPY/BILATERAL RETROGRADE PYELOGRAM / BILATERAL URETEROSCOPY/ BILATERAL STENT PLACEMENT/BILATERAL STONE BASKETING;  Surgeon: Lucas Mallow, MD;  [facility];  Service: Urology;  Laterality: Bilateral;  . GYNECOLOGIC CRYOSURGERY  1988   cervical dysplasia  . WRIST SURGERY Left 08/23/2019   Dr. Karren Cobble    ROS- all systems are reviewed and negatives except as per HPI above  Current Outpatient  Medications  Medication Sig Dispense Refill  . Bioflavonoid Products (BIOFLEX PO) Take 1 tablet by mouth daily.    . Cholecalciferol (D3-1000) 25 MCG (1000 UT) tablet Take 1,000 Units by mouth daily.    Marland Kitchen diltiazem (CARDIZEM) 30 MG tablet Take 1-2 tablets (30-60 mg total) by mouth every 4 (four) hours as needed (High heart rates). 45 tablet 2  . latanoprost (XALATAN) 0.005 % ophthalmic solution Place 1 drop into both eyes at bedtime.     . metroNIDAZOLE (METROCREAM) 0.75 % cream Apply 1 application topically 2 (two) times daily as needed (rosacea.).     Marland Kitchen Multiple Vitamins-Minerals (CENTRUM SILVER ULTRA WOMENS PO) Take 1 tablet by mouth daily.    . timolol (TIMOPTIC) 0.5 % ophthalmic solution Place 1 drop into both eyes 2 (two) times daily.     No current facility-administered medications for this visit.    Physical Exam: Vitals:   05/24/20 1223  BP: 138/82  Pulse: (!) 58  SpO2: 98%  Weight: 155 lb 9.6 oz (70.6 kg)  Height: 5' 8.5" (1.74 m)     GEN- The patient is well appearing, alert and oriented x 3 today.   Head- normocephalic, atraumatic Eyes-  Sclera clear, conjunctiva pink Ears- hearing intact Oropharynx- clear Lungs- Clear to ausculation bilaterally, normal work of breathing Heart- Regular rate and rhythm, no murmurs, rubs or gallops, PMI not laterally displaced GI- soft, NT, ND, + BS Extremities- no clubbing, cyanosis, or edema  Wt Readings from Last 3 Encounters:  05/24/20 155 lb 9.6 oz (  70.6 kg)  10/23/19 155 lb (70.3 kg)  09/29/19 159 lb (72.1 kg)    EKG tracing ordered today is personally reviewed and shows sinus rhythm, incomplete rbbb, nonspecific ST/T changes  Assessment and Plan:  1. Paroxysmal atrial fibrillation/ atrial flutter Well controlled post ablation off AAD therapy chads2vasc score is 2.  She is not on [place].  This is supported by updated guidelines.  Risks, benefits and potential toxicities for medications prescribed and/or refilled reviewed  with patient today.   AF clinic in 6 months Return in 12 months  Thompson Grayer MD, [facility] 05/24/2020 12:36 PM

## 2020-05-24 NOTE — Patient Instructions (Signed)
Medication Instructions:  Your physician recommends that you continue on your current medications as directed. Please refer to the Current Medication list given to you today.  *If you need a refill on your cardiac medications before your next appointment, please call your pharmacy*  Lab Work: None ordered.  If you have labs (blood work) drawn today and your tests are completely normal, you will receive your results only by: Marland Kitchen MyChart Message (if you have MyChart) OR . A paper copy in the mail If you have any lab test that is abnormal or we need to change your treatment, we will call you to review the results.  Testing/Procedures: None ordered.  Follow-Up: At [facility], you and your health needs are our priority.  As part of our continuing mission to provide you with exceptional heart care, we have created designated Provider Care Teams.  These Care Teams include your primary Cardiologist (physician) and Advanced Practice Providers (APPs -  Physician Assistants and Nurse Practitioners) who all work together to provide you with the care you need, when you need it.  We recommend signing up for the patient portal called "MyChart".  Sign up information is provided on this After Visit Summary.  MyChart is used to connect with patients for Virtual Visits (Telemedicine).  Patients are able to view lab/test results, encounter notes, upcoming appointments, etc.  Non-urgent messages can be sent to your provider as well.   To learn more about what you can do with MyChart, go to [email or web address].    Your next appointment:   Your physician wants you to follow-up in: 6 months in the afib clinic and 1 year with Dr. Rayann Heman.  You will receive a reminder letter in the mail two months in advance. If you don't receive a letter, please call our office to schedule the follow-up appointment.    Other Instructions:

## 2020-06-10 ENCOUNTER — Other Ambulatory Visit: Payer: Self-pay | Admitting: Obstetrics & Gynecology

## 2020-06-10 DIAGNOSIS — Z1231 Encounter for screening mammogram for malignant neoplasm of breast: Secondary | ICD-10-CM

## 2020-06-12 DIAGNOSIS — H401131 Primary open-angle glaucoma, bilateral, mild stage: Secondary | ICD-10-CM | POA: Diagnosis not present

## 2020-06-12 DIAGNOSIS — H1132 Conjunctival hemorrhage, left eye: Secondary | ICD-10-CM | POA: Diagnosis not present

## 2020-07-23 ENCOUNTER — Other Ambulatory Visit: Payer: Self-pay

## 2020-07-23 ENCOUNTER — Ambulatory Visit
Admission: RE | Admit: 2020-07-23 | Discharge: 2020-07-23 | Disposition: A | Discharge status: Home / Self Care | Payer: Medicare PPO | Source: Ambulatory Visit | Attending: Obstetrics & Gynecology | Admitting: Obstetrics & Gynecology

## 2020-07-23 DIAGNOSIS — Z1231 Encounter for screening mammogram for malignant neoplasm of breast: Secondary | ICD-10-CM

## 2020-11-26 DIAGNOSIS — Z0000 Encounter for general adult medical examination without abnormal findings: Secondary | ICD-10-CM | POA: Diagnosis not present

## 2020-11-26 DIAGNOSIS — R030 Elevated blood-pressure reading, without diagnosis of hypertension: Secondary | ICD-10-CM | POA: Diagnosis not present

## 2020-11-26 DIAGNOSIS — M25552 Pain in left hip: Secondary | ICD-10-CM | POA: Diagnosis not present

## 2020-11-26 DIAGNOSIS — D6869 Other thrombophilia: Secondary | ICD-10-CM | POA: Diagnosis not present

## 2020-11-26 DIAGNOSIS — H9190 Unspecified hearing loss, unspecified ear: Secondary | ICD-10-CM | POA: Diagnosis not present

## 2020-11-26 DIAGNOSIS — Z23 Encounter for immunization: Secondary | ICD-10-CM | POA: Diagnosis not present

## 2020-11-26 DIAGNOSIS — E785 Hyperlipidemia, unspecified: Secondary | ICD-10-CM | POA: Diagnosis not present

## 2020-11-26 DIAGNOSIS — L853 Xerosis cutis: Secondary | ICD-10-CM | POA: Diagnosis not present

## 2020-11-26 DIAGNOSIS — I480 Paroxysmal atrial fibrillation: Secondary | ICD-10-CM | POA: Diagnosis not present

## 2020-11-28 DIAGNOSIS — M25552 Pain in left hip: Secondary | ICD-10-CM | POA: Diagnosis not present

## 2020-12-17 DIAGNOSIS — H2513 Age-related nuclear cataract, bilateral: Secondary | ICD-10-CM | POA: Diagnosis not present

## 2020-12-17 DIAGNOSIS — H5213 Myopia, bilateral: Secondary | ICD-10-CM | POA: Diagnosis not present

## 2020-12-17 DIAGNOSIS — H401131 Primary open-angle glaucoma, bilateral, mild stage: Secondary | ICD-10-CM | POA: Diagnosis not present

## 2021-01-02 DIAGNOSIS — M25552 Pain in left hip: Secondary | ICD-10-CM | POA: Diagnosis not present

## 2021-01-07 DIAGNOSIS — M25452 Effusion, left hip: Secondary | ICD-10-CM | POA: Diagnosis not present

## 2021-01-07 DIAGNOSIS — M25552 Pain in left hip: Secondary | ICD-10-CM | POA: Diagnosis not present

## 2021-01-07 DIAGNOSIS — M25652 Stiffness of left hip, not elsewhere classified: Secondary | ICD-10-CM | POA: Diagnosis not present

## 2021-01-07 DIAGNOSIS — M6281 Muscle weakness (generalized): Secondary | ICD-10-CM | POA: Diagnosis not present

## 2021-01-09 DIAGNOSIS — H903 Sensorineural hearing loss, bilateral: Secondary | ICD-10-CM | POA: Diagnosis not present

## 2021-01-10 DIAGNOSIS — M25552 Pain in left hip: Secondary | ICD-10-CM | POA: Diagnosis not present

## 2021-01-10 DIAGNOSIS — M25452 Effusion, left hip: Secondary | ICD-10-CM | POA: Diagnosis not present

## 2021-01-10 DIAGNOSIS — M6281 Muscle weakness (generalized): Secondary | ICD-10-CM | POA: Diagnosis not present

## 2021-01-10 DIAGNOSIS — M25652 Stiffness of left hip, not elsewhere classified: Secondary | ICD-10-CM | POA: Diagnosis not present

## 2021-01-14 DIAGNOSIS — M25652 Stiffness of left hip, not elsewhere classified: Secondary | ICD-10-CM | POA: Diagnosis not present

## 2021-01-14 DIAGNOSIS — M25452 Effusion, left hip: Secondary | ICD-10-CM | POA: Diagnosis not present

## 2021-01-14 DIAGNOSIS — M25552 Pain in left hip: Secondary | ICD-10-CM | POA: Diagnosis not present

## 2021-01-14 DIAGNOSIS — M6281 Muscle weakness (generalized): Secondary | ICD-10-CM | POA: Diagnosis not present

## 2021-01-16 DIAGNOSIS — M6281 Muscle weakness (generalized): Secondary | ICD-10-CM | POA: Diagnosis not present

## 2021-01-16 DIAGNOSIS — M25452 Effusion, left hip: Secondary | ICD-10-CM | POA: Diagnosis not present

## 2021-01-16 DIAGNOSIS — M25652 Stiffness of left hip, not elsewhere classified: Secondary | ICD-10-CM | POA: Diagnosis not present

## 2021-01-16 DIAGNOSIS — M25552 Pain in left hip: Secondary | ICD-10-CM | POA: Diagnosis not present

## 2021-01-21 DIAGNOSIS — M25452 Effusion, left hip: Secondary | ICD-10-CM | POA: Diagnosis not present

## 2021-01-21 DIAGNOSIS — M25552 Pain in left hip: Secondary | ICD-10-CM | POA: Diagnosis not present

## 2021-01-21 DIAGNOSIS — M25652 Stiffness of left hip, not elsewhere classified: Secondary | ICD-10-CM | POA: Diagnosis not present

## 2021-01-21 DIAGNOSIS — M6281 Muscle weakness (generalized): Secondary | ICD-10-CM | POA: Diagnosis not present

## 2021-01-24 DIAGNOSIS — M25652 Stiffness of left hip, not elsewhere classified: Secondary | ICD-10-CM | POA: Diagnosis not present

## 2021-01-24 DIAGNOSIS — M6281 Muscle weakness (generalized): Secondary | ICD-10-CM | POA: Diagnosis not present

## 2021-01-24 DIAGNOSIS — M25452 Effusion, left hip: Secondary | ICD-10-CM | POA: Diagnosis not present

## 2021-01-24 DIAGNOSIS — M25552 Pain in left hip: Secondary | ICD-10-CM | POA: Diagnosis not present

## 2021-01-28 DIAGNOSIS — M25452 Effusion, left hip: Secondary | ICD-10-CM | POA: Diagnosis not present

## 2021-01-28 DIAGNOSIS — M25552 Pain in left hip: Secondary | ICD-10-CM | POA: Diagnosis not present

## 2021-01-28 DIAGNOSIS — M6281 Muscle weakness (generalized): Secondary | ICD-10-CM | POA: Diagnosis not present

## 2021-01-28 DIAGNOSIS — M25652 Stiffness of left hip, not elsewhere classified: Secondary | ICD-10-CM | POA: Diagnosis not present

## 2021-01-31 DIAGNOSIS — M25652 Stiffness of left hip, not elsewhere classified: Secondary | ICD-10-CM | POA: Diagnosis not present

## 2021-01-31 DIAGNOSIS — M25452 Effusion, left hip: Secondary | ICD-10-CM | POA: Diagnosis not present

## 2021-01-31 DIAGNOSIS — M25552 Pain in left hip: Secondary | ICD-10-CM | POA: Diagnosis not present

## 2021-01-31 DIAGNOSIS — M6281 Muscle weakness (generalized): Secondary | ICD-10-CM | POA: Diagnosis not present

## 2021-02-03 DIAGNOSIS — M6281 Muscle weakness (generalized): Secondary | ICD-10-CM | POA: Diagnosis not present

## 2021-02-03 DIAGNOSIS — M25652 Stiffness of left hip, not elsewhere classified: Secondary | ICD-10-CM | POA: Diagnosis not present

## 2021-02-03 DIAGNOSIS — M25552 Pain in left hip: Secondary | ICD-10-CM | POA: Diagnosis not present

## 2021-02-03 DIAGNOSIS — M25452 Effusion, left hip: Secondary | ICD-10-CM | POA: Diagnosis not present

## 2021-02-11 DIAGNOSIS — M25652 Stiffness of left hip, not elsewhere classified: Secondary | ICD-10-CM | POA: Diagnosis not present

## 2021-02-11 DIAGNOSIS — M25552 Pain in left hip: Secondary | ICD-10-CM | POA: Diagnosis not present

## 2021-02-11 DIAGNOSIS — M6281 Muscle weakness (generalized): Secondary | ICD-10-CM | POA: Diagnosis not present

## 2021-02-11 DIAGNOSIS — M25452 Effusion, left hip: Secondary | ICD-10-CM | POA: Diagnosis not present

## 2021-02-13 DIAGNOSIS — M25452 Effusion, left hip: Secondary | ICD-10-CM | POA: Diagnosis not present

## 2021-02-13 DIAGNOSIS — M25652 Stiffness of left hip, not elsewhere classified: Secondary | ICD-10-CM | POA: Diagnosis not present

## 2021-02-13 DIAGNOSIS — M6281 Muscle weakness (generalized): Secondary | ICD-10-CM | POA: Diagnosis not present

## 2021-02-13 DIAGNOSIS — M25552 Pain in left hip: Secondary | ICD-10-CM | POA: Diagnosis not present

## 2021-02-18 DIAGNOSIS — M25452 Effusion, left hip: Secondary | ICD-10-CM | POA: Diagnosis not present

## 2021-02-18 DIAGNOSIS — M25652 Stiffness of left hip, not elsewhere classified: Secondary | ICD-10-CM | POA: Diagnosis not present

## 2021-02-18 DIAGNOSIS — M25552 Pain in left hip: Secondary | ICD-10-CM | POA: Diagnosis not present

## 2021-02-18 DIAGNOSIS — M6281 Muscle weakness (generalized): Secondary | ICD-10-CM | POA: Diagnosis not present

## 2021-02-20 DIAGNOSIS — M21611 Bunion of right foot: Secondary | ICD-10-CM | POA: Diagnosis not present

## 2021-02-20 DIAGNOSIS — M25552 Pain in left hip: Secondary | ICD-10-CM | POA: Diagnosis not present

## 2021-02-21 DIAGNOSIS — M6281 Muscle weakness (generalized): Secondary | ICD-10-CM | POA: Diagnosis not present

## 2021-02-21 DIAGNOSIS — M25652 Stiffness of left hip, not elsewhere classified: Secondary | ICD-10-CM | POA: Diagnosis not present

## 2021-02-21 DIAGNOSIS — M25452 Effusion, left hip: Secondary | ICD-10-CM | POA: Diagnosis not present

## 2021-02-21 DIAGNOSIS — M25552 Pain in left hip: Secondary | ICD-10-CM | POA: Diagnosis not present

## 2021-02-24 DIAGNOSIS — M25652 Stiffness of left hip, not elsewhere classified: Secondary | ICD-10-CM | POA: Diagnosis not present

## 2021-02-24 DIAGNOSIS — M6281 Muscle weakness (generalized): Secondary | ICD-10-CM | POA: Diagnosis not present

## 2021-02-24 DIAGNOSIS — M25552 Pain in left hip: Secondary | ICD-10-CM | POA: Diagnosis not present

## 2021-02-24 DIAGNOSIS — M25452 Effusion, left hip: Secondary | ICD-10-CM | POA: Diagnosis not present

## 2021-02-26 DIAGNOSIS — M25652 Stiffness of left hip, not elsewhere classified: Secondary | ICD-10-CM | POA: Diagnosis not present

## 2021-02-26 DIAGNOSIS — M25452 Effusion, left hip: Secondary | ICD-10-CM | POA: Diagnosis not present

## 2021-02-26 DIAGNOSIS — M25552 Pain in left hip: Secondary | ICD-10-CM | POA: Diagnosis not present

## 2021-02-26 DIAGNOSIS — M6281 Muscle weakness (generalized): Secondary | ICD-10-CM | POA: Diagnosis not present

## 2021-06-04 DIAGNOSIS — R040 Epistaxis: Secondary | ICD-10-CM | POA: Diagnosis not present

## 2021-06-05 DIAGNOSIS — Z7901 Long term (current) use of anticoagulants: Secondary | ICD-10-CM | POA: Diagnosis not present

## 2021-06-05 DIAGNOSIS — J342 Deviated nasal septum: Secondary | ICD-10-CM | POA: Diagnosis not present

## 2021-06-05 DIAGNOSIS — R040 Epistaxis: Secondary | ICD-10-CM | POA: Diagnosis not present

## 2021-06-13 ENCOUNTER — Other Ambulatory Visit: Payer: Self-pay | Admitting: Family Medicine

## 2021-06-13 DIAGNOSIS — Z1231 Encounter for screening mammogram for malignant neoplasm of breast: Secondary | ICD-10-CM

## 2021-07-02 DIAGNOSIS — H5213 Myopia, bilateral: Secondary | ICD-10-CM | POA: Diagnosis not present

## 2021-07-02 DIAGNOSIS — H401131 Primary open-angle glaucoma, bilateral, mild stage: Secondary | ICD-10-CM | POA: Diagnosis not present

## 2021-07-02 DIAGNOSIS — H2513 Age-related nuclear cataract, bilateral: Secondary | ICD-10-CM | POA: Diagnosis not present

## 2021-07-23 DIAGNOSIS — D1801 Hemangioma of skin and subcutaneous tissue: Secondary | ICD-10-CM | POA: Diagnosis not present

## 2021-07-23 DIAGNOSIS — L720 Epidermal cyst: Secondary | ICD-10-CM | POA: Diagnosis not present

## 2021-07-23 DIAGNOSIS — L918 Other hypertrophic disorders of the skin: Secondary | ICD-10-CM | POA: Diagnosis not present

## 2021-07-23 DIAGNOSIS — L718 Other rosacea: Secondary | ICD-10-CM | POA: Diagnosis not present

## 2021-07-23 DIAGNOSIS — L820 Inflamed seborrheic keratosis: Secondary | ICD-10-CM | POA: Diagnosis not present

## 2021-07-23 DIAGNOSIS — L821 Other seborrheic keratosis: Secondary | ICD-10-CM | POA: Diagnosis not present

## 2021-07-23 DIAGNOSIS — L814 Other melanin hyperpigmentation: Secondary | ICD-10-CM | POA: Diagnosis not present

## 2021-07-24 ENCOUNTER — Ambulatory Visit
Admission: RE | Admit: 2021-07-24 | Discharge: 2021-07-24 | Disposition: A | Discharge status: Home / Self Care | Payer: Medicare PPO | Source: Ambulatory Visit | Attending: Family Medicine | Admitting: Family Medicine

## 2021-07-24 DIAGNOSIS — Z1231 Encounter for screening mammogram for malignant neoplasm of breast: Secondary | ICD-10-CM

## 2021-08-21 DIAGNOSIS — H2512 Age-related nuclear cataract, left eye: Secondary | ICD-10-CM | POA: Diagnosis not present

## 2021-08-21 DIAGNOSIS — H25812 Combined forms of age-related cataract, left eye: Secondary | ICD-10-CM | POA: Diagnosis not present

## 2021-08-28 DIAGNOSIS — H2511 Age-related nuclear cataract, right eye: Secondary | ICD-10-CM | POA: Diagnosis not present

## 2021-10-01 DIAGNOSIS — Z961 Presence of intraocular lens: Secondary | ICD-10-CM | POA: Diagnosis not present

## 2021-12-15 DIAGNOSIS — R29818 Other symptoms and signs involving the nervous system: Secondary | ICD-10-CM | POA: Diagnosis not present

## 2021-12-15 DIAGNOSIS — M8588 Other specified disorders of bone density and structure, other site: Secondary | ICD-10-CM | POA: Diagnosis not present

## 2021-12-15 DIAGNOSIS — L600 Ingrowing nail: Secondary | ICD-10-CM | POA: Diagnosis not present

## 2021-12-15 DIAGNOSIS — R030 Elevated blood-pressure reading, without diagnosis of hypertension: Secondary | ICD-10-CM | POA: Diagnosis not present

## 2021-12-15 DIAGNOSIS — H9193 Unspecified hearing loss, bilateral: Secondary | ICD-10-CM | POA: Diagnosis not present

## 2021-12-15 DIAGNOSIS — D6869 Other thrombophilia: Secondary | ICD-10-CM | POA: Diagnosis not present

## 2021-12-15 DIAGNOSIS — I480 Paroxysmal atrial fibrillation: Secondary | ICD-10-CM | POA: Diagnosis not present

## 2021-12-15 DIAGNOSIS — E785 Hyperlipidemia, unspecified: Secondary | ICD-10-CM | POA: Diagnosis not present

## 2021-12-15 DIAGNOSIS — Z0000 Encounter for general adult medical examination without abnormal findings: Secondary | ICD-10-CM | POA: Diagnosis not present

## 2021-12-16 ENCOUNTER — Other Ambulatory Visit: Payer: Self-pay | Admitting: Family Medicine

## 2021-12-16 DIAGNOSIS — M8580 Other specified disorders of bone density and structure, unspecified site: Secondary | ICD-10-CM

## 2021-12-18 DIAGNOSIS — H903 Sensorineural hearing loss, bilateral: Secondary | ICD-10-CM | POA: Diagnosis not present

## 2021-12-23 ENCOUNTER — Ambulatory Visit: Payer: Medicare PPO | Admitting: Cardiology

## 2021-12-23 ENCOUNTER — Encounter: Payer: Self-pay | Admitting: Cardiology

## 2021-12-23 VITALS — BP 132/78 | HR 54 | Ht 68.5 in | Wt 158.0 lb

## 2021-12-23 DIAGNOSIS — I480 Paroxysmal atrial fibrillation: Secondary | ICD-10-CM | POA: Diagnosis not present

## 2021-12-23 NOTE — Progress Notes (Signed)
? ?Electrophysiology Office Note ? ? ?Date:  12/23/2021  ? ?ID:  Cathy Montoya, DOB 05-Aug-1946, MRN [id number] ? ?PCP:  Harlan Stains, MD  ?Cardiologist:  Angelena Form ?Primary Electrophysiologist:  Felissa Blouch Meredith Leeds, MD   ? ?Chief Complaint: AF ?  ?History of Present Illness: ?Cathy Montoya is a 76 y.o. female who is being seen today for the evaluation of AF at the request of Harlan Stains, MD. Presenting today for electrophysiology evaluation. ? ?She has a history significant for right-sided breast cancer status post chemo and radiation, paroxysmal atrial fibrillation, typical atrial flutter.  She is status post ablation for atrial flutter 02/22/2018 with atrial fibrillation ablation 04/18/2019. ? ?Today, she denies symptoms of palpitations, chest pain, orthopnea, PND, lower extremity edema, claudication, dizziness, presyncope, syncope, bleeding, or neurologic sequela. The patient is tolerating medications without difficulties.  Feels well.  She has no chest pain.  She does get short of breath when she exerts herself, walking her dog up a hill or climbing stairs.  He feels that she is somewhat deconditioned.  Aside from that, she has done well.  She has noted no further episodes of atrial fibrillation or atrial flutter. ? ? ?Past Medical History:  ?Diagnosis Date  ? Abnormal Pap smear   ? Acne rosacea   ? Alopecia   ? frontal  ? Arthritis   ? knees, lt  hip, lower back, hands, shoulders  ? Breast cancer, right ([place]) 04/11/2004  ? invasive lobular carcinoma treated with 1 chemo treatment and radiation  ? Cat allergies   ? History of kidney stones   ? Intermittent vertigo   ? FRONTAL FIBROSIS ALOPECIA  ? Lipoma   ? Paroxysmal atrial fibrillation (HCC)   ? Shoulder pain, left   ? Typical atrial flutter ([place])   ? ?Past Surgical History:  ?Procedure Laterality Date  ? A-FLUTTER ABLATION N/A 04/21/2018  ? Procedure: A-FLUTTER ABLATION;  Surgeon: Thompson Grayer, MD;  Location: [place] CV LAB;  Service:  Cardiovascular;  Laterality: N/A;  ? ATRIAL FIBRILLATION ABLATION N/A 04/18/2019  ? Procedure: ATRIAL FIBRILLATION ABLATION;  Surgeon: Thompson Grayer, MD;  Location: [place] CV LAB;  Service: Cardiovascular;  Laterality: N/A;  ? BREAST LUMPECTOMY  01/10/2004  ? lymph node, chemo x 1/radiation  ? COLONOSCOPY    ? CYSTOSCOPY/URETEROSCOPY/HOLMIUM LASER/STENT PLACEMENT Bilateral 10/21/2018  ? Procedure: CYSTOSCOPY/BILATERAL RETROGRADE PYELOGRAM / BILATERAL URETEROSCOPY/ BILATERAL STENT PLACEMENT/BILATERAL STONE BASKETING;  Surgeon: Lucas Mallow, MD;  [facility];  Service: Urology;  Laterality: Bilateral;  ? GYNECOLOGIC CRYOSURGERY  1988  ? cervical dysplasia  ? WRIST SURGERY Left 08/23/2019  ? Dr. Karren Cobble  ? ? ? ?Current Outpatient Medications  ?Medication Sig Dispense Refill  ? Cholecalciferol (D3-1000) 25 MCG (1000 UT) tablet Take 1,000 Units by mouth daily.    ? diltiazem (CARDIZEM) 30 MG tablet Take 1-2 tablets (30-60 mg total) by mouth every 4 (four) hours as needed (High heart rates). 45 tablet 2  ? Glucosamine HCl (GLUCOSAMINE PO) Take 1 capsule by mouth daily.    ? Multiple Vitamins-Minerals (CENTRUM SILVER ULTRA WOMENS PO) Take 1 tablet by mouth daily.    ? timolol (TIMOPTIC) 0.5 % ophthalmic solution Place 1 drop into both eyes 2 (two) times daily.    ? Turmeric (QC TUMERIC COMPLEX PO) Take 1 capsule by mouth daily.    ? ?No current facility-administered medications for this visit.  ? ? ?Allergies:   Cat hair extract, Mixed feathers, and Amoxicillin  ? ?Social History:  The patient  reports that she has never smoked. She has never used smokeless tobacco. She reports current alcohol use. She reports that she does not use drugs.  ? ?Family History:  The patient's family history includes Alzheimer's disease in her mother; Breast cancer in her maternal grandmother, paternal grandmother, and another family member; Breast cancer (age of onset: 70) in her sister; Cancer in her father, maternal  grandmother, and another family member; Hypertension in her father; Other in her maternal grandfather; Thyroid disease in her daughter and mother.  ? ? ?ROS:  Please see the history of present illness.   Otherwise, review of systems is positive for none.   All other systems are reviewed and negative.  ? ? ?PHYSICAL EXAM: ?VS:  BP 132/78   Pulse (!) 54   Ht 5' 8.5" (1.74 m)   Wt 158 lb (71.7 kg)   LMP 08/17/1998   SpO2 98%   BMI 23.67 kg/m?  , BMI Body mass index is 23.67 kg/m?. ?GEN: Well nourished, well developed, in no acute distress  ?HEENT: normal  ?Neck: no JVD, carotid bruits, or masses ?Cardiac: RRR; no murmurs, rubs, or gallops,no edema  ?Respiratory:  clear to auscultation bilaterally, normal work of breathing ?GI: soft, nontender, nondistended, + BS ?MS: no deformity or atrophy  ?Skin: warm and dry ?Neuro:  Strength and sensation are intact ?Psych: euthymic mood, full affect ? ?EKG:  EKG is ordered today. ?Personal review of the ekg ordered shows sinus rhythm, rate 54 ? ?Recent Labs: ?No results found for requested labs within last 8760 hours.  ? ? ?Lipid Panel  ?No results found for: CHOL, TRIG, HDL, CHOLHDL, VLDL, LDLCALC, LDLDIRECT ? ? ?Wt Readings from Last 3 Encounters:  ?12/23/21 158 lb (71.7 kg)  ?05/24/20 155 lb 9.6 oz (70.6 kg)  ?10/23/19 155 lb (70.3 kg)  ?  ? ? ?Other studies Reviewed: ?Additional studies/ records that were reviewed today include: TTE 2019  ?Review of the above records today demonstrates:  ?- Left ventricle: The cavity size was normal. Systolic function was  ?  normal. The estimated ejection fraction was in the range of 55%  ?  to 60%. Wall motion was normal; there were no regional wall  ?  motion abnormalities. Features are consistent with a pseudonormal  ?  left ventricular filling pattern, with concomitant abnormal  ?  relaxation and increased filling pressure (grade 2 diastolic  ?  dysfunction).  ?- Aortic valve: There was mild regurgitation.  ?- Right atrium: The  atrium was mildly dilated.  ?- Pulmonary arteries: Systolic pressure was mildly increased. PA  ?  peak pressure: 44 mm Hg (S). ? ? ?ASSESSMENT AND PLAN: ? ?1.  Paroxysmal atrial fibrillation/atrial flutter: CHA2DS2-VASc of 2.  Currently not on [place].  Status post ablation for both atrial fibrillation and atrial flutter.  She has had no further arrhythmias.  No changes. ? ?2.  Shortness of breath: She has dyspnea when she exerts herself.  We did speak about repeating her echo, though she would like to hold off for now.  She has no heart failure symptoms.  She Nathanyl Andujo work on getting more exercise to see if this helps. ? ? ? ?Current medicines are reviewed at length with the patient today.   ?The patient does not have concerns regarding her medicines.  The following changes were made today:  none ? ?Labs/ tests ordered today include:  ?Orders Placed This Encounter  ?Procedures  ? EKG 12-Lead  ? ? ? ?Disposition:  FU with Anneta Rounds 1 year ? ?Signed, ?Shalon Councilman Meredith Leeds, MD  ?12/23/2021 10:16 AM    ? ?[facility] ?[address] ?[address] ?[place] [address] ?[phone] (office) ?[phone] (fax) ? ?

## 2021-12-23 NOTE — Patient Instructions (Signed)
Medication Instructions:  ?Your physician recommends that you continue on your current medications as directed. Please refer to the Current Medication list given to you today. ? ?*If you need a refill on your cardiac medications before your next appointment, please call your pharmacy* ? ? ?Lab Work: ?None ordered ? ? ?Testing/Procedures: ?None ordered ? ? ?Follow-Up: ?At [facility], you and your health needs are our priority.  As part of our continuing mission to provide you with exceptional heart care, we have created designated Provider Care Teams.  These Care Teams include your primary Cardiologist (physician) and Advanced Practice Providers (APPs -  Physician Assistants and Nurse Practitioners) who all work together to provide you with the care you need, when you need it. ? ?Your next appointment:   ?1 year(s) ? ?The format for your next appointment:   ?In Person ? ?Provider:   ?You will see one of the following Advanced Practice Providers on your designated Care Team:   ?Tommye Standard, PA-C ?Legrand Como "Jonni Sanger" [place], PA-C  ? ? ?Thank you for choosing [facility]!! ? ? ?Trinidad Curet, RN ?[phone] ? ?Other Instructions ? ? ? ?Important Information About [place] ? ? ? ? ? ? ? ? ?  ?

## 2021-12-25 ENCOUNTER — Ambulatory Visit: Payer: Medicare PPO | Admitting: Podiatry

## 2021-12-25 ENCOUNTER — Ambulatory Visit (INDEPENDENT_AMBULATORY_CARE_PROVIDER_SITE_OTHER): Payer: Medicare PPO

## 2021-12-25 ENCOUNTER — Encounter: Payer: Self-pay | Admitting: Podiatry

## 2021-12-25 DIAGNOSIS — M21612 Bunion of left foot: Secondary | ICD-10-CM | POA: Diagnosis not present

## 2021-12-25 DIAGNOSIS — B351 Tinea unguium: Secondary | ICD-10-CM | POA: Diagnosis not present

## 2021-12-25 DIAGNOSIS — M79674 Pain in right toe(s): Secondary | ICD-10-CM | POA: Diagnosis not present

## 2021-12-25 DIAGNOSIS — M21611 Bunion of right foot: Secondary | ICD-10-CM

## 2021-12-25 DIAGNOSIS — M79675 Pain in left toe(s): Secondary | ICD-10-CM | POA: Diagnosis not present

## 2021-12-25 DIAGNOSIS — M21619 Bunion of unspecified foot: Secondary | ICD-10-CM | POA: Diagnosis not present

## 2021-12-25 DIAGNOSIS — L600 Ingrowing nail: Secondary | ICD-10-CM | POA: Diagnosis not present

## 2021-12-25 NOTE — Progress Notes (Signed)
Subjective:  ? ?Patient ID: Cathy Montoya, female   DOB: 76 y.o.   MRN: [id number]  ? ?HPI ?Patient presents stating she gets problems between her big toe second toe right she is getting thick toenails that she cannot cut with ingrown components and has a bunion deformity right over left foot.  Patient states this has been an ongoing issue getting worse gradually and patient ? ? ?Review of Systems  ?All other systems reviewed and are negative. ? ? ?   ?Objective:  ?Physical Exam ?Vitals and nursing note reviewed.  ?Constitutional:   ?   Appearance: She is well-developed.  ?Pulmonary:  ?   Effort: Pulmonary effort is normal.  ?Musculoskeletal:     ?   General: Normal range of motion.  ?Skin: ?   General: Skin is warm.  ?Neurological:  ?   Mental Status: She is alert.  ?  ?Neurovascular status found to be intact muscle strength was found to be adequate range of motion adequate.  Patient does have prominent bunion deformity right foot has deviation with slight pressure between the hallux second toe right over left and is noted to have thick yellow brittle nailbeds with incurvation of the beds bilateral with intense discomfort more around the lateral border of the hallux right over left.  Good digital perfusion well oriented x3 ? ?   ?Assessment:  ?Structural bunion deformity right over left moderate discomfort along with chronic mycotic nail infection with ingrown component 1-5 both feet ? ?   ?Plan:  ?H&P educated her on all conditions for the bunion do not recommend current surgery but did discuss wider shoe gear cushion type shoes.  She can continue to use a pad between the hallux second toe I debrided nails 1-5 both feet taking out corners that were ear ingrown and sore and did discuss possibility for ingrown toenail correction at 1 point in future ? ?X-rays indicate there is structural bunion deformity right over left with elevation of the intermetatarsal angle and pressure between the hallux and second toe ?    ? ? ?

## 2022-02-24 DIAGNOSIS — H401132 Primary open-angle glaucoma, bilateral, moderate stage: Secondary | ICD-10-CM | POA: Diagnosis not present

## 2022-03-24 DIAGNOSIS — H401132 Primary open-angle glaucoma, bilateral, moderate stage: Secondary | ICD-10-CM | POA: Diagnosis not present

## 2022-04-15 DIAGNOSIS — H9041 Sensorineural hearing loss, unilateral, right ear, with unrestricted hearing on the contralateral side: Secondary | ICD-10-CM | POA: Diagnosis not present

## 2022-05-01 ENCOUNTER — Other Ambulatory Visit: Payer: Self-pay | Admitting: Otolaryngology

## 2022-05-01 DIAGNOSIS — H918X9 Other specified hearing loss, unspecified ear: Secondary | ICD-10-CM

## 2022-05-21 ENCOUNTER — Ambulatory Visit
Admission: RE | Admit: 2022-05-21 | Discharge: 2022-05-21 | Disposition: A | Discharge status: Home / Self Care | Payer: Medicare PPO | Source: Ambulatory Visit | Attending: Otolaryngology | Admitting: Otolaryngology

## 2022-05-21 DIAGNOSIS — H918X3 Other specified hearing loss, bilateral: Secondary | ICD-10-CM

## 2022-05-21 MED ORDER — GADOPICLENOL 0.5 MMOL/ML IV SOLN
7.0000 mL | Freq: Once | INTRAVENOUS | Status: AC | PRN
  Administered 2022-05-21: 7 mL via INTRAVENOUS

## 2022-05-27 DIAGNOSIS — R42 Dizziness and giddiness: Secondary | ICD-10-CM | POA: Diagnosis not present

## 2022-05-27 DIAGNOSIS — D333 Benign neoplasm of cranial nerves: Secondary | ICD-10-CM | POA: Diagnosis not present

## 2022-05-27 DIAGNOSIS — H9041 Sensorineural hearing loss, unilateral, right ear, with unrestricted hearing on the contralateral side: Secondary | ICD-10-CM | POA: Diagnosis not present

## 2022-06-08 NOTE — Therapy (Signed)
OUTPATIENT PHYSICAL THERAPY VESTIBULAR EVALUATION     Patient Name: Cathy Montoya MRN: [id number] DOB:26-Feb-1946, 76 y.o., female Today's Date: 06/09/2022   PT End of Session - 06/09/22 1235     Visit Number 1    Number of Visits 13    Date for PT Re-Evaluation 07/21/22    Authorization Type [employer]    PT Start Time 0848    PT Stop Time 0929    PT Time Calculation (min) 41 min    Equipment Utilized During Treatment Gait belt    Activity Tolerance Patient tolerated treatment well    Behavior During Therapy WFL for tasks assessed/performed             Past Medical History:  Diagnosis Date   Abnormal Pap smear    Acne rosacea    Alopecia    frontal   Arthritis    knees, lt  hip, lower back, hands, shoulders   Breast cancer, right ([place]) 04/11/2004   invasive lobular carcinoma treated with 1 chemo treatment and radiation   Cat allergies    History of kidney stones    Intermittent vertigo    FRONTAL FIBROSIS ALOPECIA   Lipoma    Paroxysmal atrial fibrillation (H[facility])    Shoulder pain, left    Typical atrial flutter ([place])    Past Surgical History:  Procedure Laterality Date   A-FLUTTER ABLATION N/A 04/21/2018   Procedure: A-FLUTTER ABLATION;  Surgeon: Thompson Grayer, MD;  Location: [facility];  Service: Cardiovascular;  Laterality: N/A;   ATRIAL FIBRILLATION ABLATION N/A 04/18/2019   Procedure: ATRIAL FIBRILLATION ABLATION;  Surgeon: Thompson Grayer, MD;  Location: [facility];  Service: Cardiovascular;  Laterality: N/A;   BREAST LUMPECTOMY  01/10/2004   lymph node, chemo x 1/radiation   COLONOSCOPY     CYSTOSCOPY/URETEROSCOPY/HOLMIUM LASER/STENT PLACEMENT Bilateral 10/21/2018   Procedure: CYSTOSCOPY/BILATERAL RETROGRADE PYELOGRAM / BILATERAL URETEROSCOPY/ BILATERAL STENT PLACEMENT/BILATERAL STONE BASKETING;  Surgeon: Lucas Mallow, MD;  Location: [facility];  Service: Urology;  Laterality: Bilateral;   GYNECOLOGIC CRYOSURGERY  1988   cervical  dysplasia   WRIST SURGERY Left 08/23/2019   Dr. Karren Cobble   Patient Active Problem List   Diagnosis Date Noted   Paroxysmal atrial fibrillation ([place]) 05/17/2019   Typical atrial flutter ([place])    Left hip pain    Breast cancer, right ([place]) 07/27/2012    PCP: Harlan Stains, MD REFERRING PROVIDER: Leta Baptist, MD  REFERRING DIAG: Dizziness  THERAPY DIAG:  Unsteadiness on feet  Dizziness and giddiness  Other abnormalities of gait and mobility  ONSET DATE: couple years   Rationale for Evaluation and Treatment Rehabilitation  SUBJECTIVE:   SUBJECTIVE STATEMENT: Patient reports R aural pressure and hearing loss. Reports for the last few years her balance hasn't been as good. Reports 2 spells of dizziness in the distant past, however doesn't typically have dizziness day to day. Had a fall when she broke her wrist. Reports difficulty getting off the floor- reports hx of L bursitis (admits to not doing her HEP for her hip lately). Denies vision changes/double vision, otalgia, photo/phonophobia, migraines. Reports 1 episode of tinnitus in the past couple months. Some difficulty getting out of a chair, climbing stairs while holding something.   Pt accompanied by: self  PERTINENT HISTORY: R breast CA s/p lumpectomy, chemo, radiation, a-fib and a-flutter, L wrist surgery  PAIN:  Are you having pain? No  PRECAUTIONS: Fall  WEIGHT BEARING RESTRICTIONS: No  FALLS: Has patient fallen  in last 6 months? No  LIVING ENVIRONMENT: Lives with: lives with their spouse Lives in: House/apartment Stairs:  4-5 steps to enter with a handrail; 2nd story Has following equipment at home: None  PLOF: Independent; retired  PATIENT GOALS: improve balance   OBJECTIVE:   DIAGNOSTIC FINDINGS: recent MRI revealed R vestibular schwannoma   COGNITION: Overall cognitive status: Within functional limits for tasks assessed   SENSATION: WFL   POSTURE:  rounded shoulders, forward head, and  increased thoracic kyphosis  GAIT: Gait pattern:  Antalgia on L LE and slightly shortened step length  Assistive device utilized: None Level of assistance: Complete Independence    PATIENT SURVEYS:  FOTO NT- patient denies dizziness   VESTIBULAR ASSESSMENT:  GENERAL OBSERVATION: wears progressive lenses most of the time    OCULOMOTOR EXAM:  Ocular Alignment:  R eye esotropia  Ocular ROM: No Limitations  Spontaneous Nystagmus: absent  Gaze-Induced Nystagmus: absent  Smooth Pursuits: intact  Saccades: intact   VESTIBULAR - OCULAR REFLEX:   Slow VOR: Comment: slow and slightly woozy horizontal; intact vertical   VOR Cancellation: Normal  Head-Impulse Test: HIT Right: positive HIT Left: positive      POSITIONAL TESTING:  Right Sidelying: negative; no dizziness Left Sidelying: negative; no dizziness    M-CTSIB  Condition 1: Firm Surface, EO 30 Sec, Normal Sway  Condition 2: Firm Surface, EC 30 Sec, Mild and Moderate Sway  Condition 3: Foam Surface, EO 30 Sec, Mild and Moderate Sway  Condition 4: Foam Surface, EC 20 Sec, Severe Sway     [facility] PT Assessment - 06/09/22 0001       Standardized Balance Assessment   Standardized Balance Assessment Dynamic Gait Index      Dynamic Gait Index   Level Surface Normal    Change in Gait Speed Normal    Gait with Horizontal Head Turns Moderate Impairment    Gait with Vertical Head Turns Moderate Impairment   mild dizziness   Gait and Pivot Turn Mild Impairment    Step Over Obstacle Mild Impairment    Step Around Obstacles Mild Impairment    Steps Mild Impairment    Total Score 16               VESTIBULAR TREATMENT:                                                                                                  DATE: 06/09/22   PATIENT EDUCATION: Education details: prognosis, POC, HEP; answering pt's questions on benefit of vestibular rehab for existing schwannoma  Person educated: Patient Education method:  Explanation, Demonstration, Tactile cues, Verbal cues, and Handouts Education comprehension: verbalized understanding and returned demonstration  HOME EXERCISE PROGRAM: Access Code: [email or web address] URL: [email or web address] Date: 06/09/2022 Prepared by: [facility]  Exercises - Standing Gaze Stabilization with Head Rotation  - 1 x daily - 5 x weekly - 2 sets - 30 sec hold - Romberg Stance with Eyes Closed  - 1 x daily - 5 x weekly - 2 sets - 30 sec hold - Romberg  Stance on Foam Pad  - 1 x daily - 5 x weekly - 2 sets - 30 sec hold - 180 Degree Pivot Turn with Single Point Cane  - 1 x daily - 5 x weekly - 2 sets - 10 reps   GOALS: Goals reviewed with patient? Yes  SHORT TERM GOALS: Target date: 06/30/2022  Patient to be independent with initial HEP. Baseline: HEP initiated Goal status: INITIAL    LONG TERM GOALS: Target date: 07/21/2022  Patient to be independent with advanced HEP. Baseline: Not yet initiated  Goal status: INITIAL  Patient to report 0/10 dizziness with standing vertical and horizontal VOR for 30 seconds. Baseline: symptomatic Goal status: INITIAL  Patient to demonstrate moderate sway with M-CTSIB condition with eyes closed/foam surface in order to improve safety in environments with uneven surfaces and dim lighting. Baseline: severe Goal status: INITIAL  Patient to score at least 20/24 on DGI in order to decrease risk of falls. Baseline: 16/24 Goal status: INITIAL  Patient to demonstrate safe alternating reciprocal stair navigation while holding object in hands with good stability.  Baseline: notes difficulty Goal status: INITIAL   ASSESSMENT:  CLINICAL IMPRESSION:   Patient is a 76 y/o F presenting to [facility] with c/o worsening balance for the past couple of years. Recent MRI revealed R vestibular schwannoma. Patient reports infrequent dizziness. Does reports hx and current L hip pain, which makes it  difficult to stand from a chair, get up from the floor, and also reports instability with navigating stairs while carrying something. Denies vision changes/double vision, otalgia, photo/phonophobia, migraines. Reports 1 episode of tinnitus in the past couple months. Oculomotor exam revealed slight R eye esotropia, symptomatic horizontal VOR, positive B HIT. Positional testing was negative. Balance testing indicates increased risk of falls and difficulty utilizing vestibular system for balance. Patient was educated on VOR and balance HEP and reported understanding. Would benefit from skilled PT services 1-2x/week for 6 weeks to address aforementioned impairments in order to optimize level of function.    OBJECTIVE IMPAIRMENTS: Abnormal gait, decreased activity tolerance, decreased balance, difficulty walking, decreased ROM, decreased strength, dizziness, improper body mechanics, postural dysfunction, and pain.   ACTIVITY LIMITATIONS: carrying, lifting, bending, standing, squatting, stairs, transfers, bed mobility, bathing, toileting, dressing, reach over head, and hygiene/grooming  PARTICIPATION LIMITATIONS: meal prep, cleaning, laundry, driving, shopping, community activity, yard work, and church  PERSONAL FACTORS: Age, Past/current experiences, Time since onset of injury/illness/exacerbation, and 3+ comorbidities: R breast CA s/p lumpectomy, chemo, radiation, a-fib and a-flutter, L wrist surgery  are also affecting patient's functional outcome.   REHAB POTENTIAL: Good  CLINICAL DECISION MAKING: Evolving/moderate complexity  EVALUATION COMPLEXITY: Moderate   PLAN: PT FREQUENCY: 1-2x/week  PT DURATION: 6 weeks  PLANNED INTERVENTIONS: Therapeutic exercises, Therapeutic activity, Neuromuscular re-education, Balance training, Gait training, Patient/Family education, Self Care, Joint mobilization, Stair training, Vestibular training, Canalith repositioning, DME instructions, Aquatic Therapy, Dry  Needling, Electrical stimulation, Cryotherapy, Moist heat, Taping, Manual therapy, and Re-evaluation  PLAN FOR NEXT SESSION: reassess HEP; continued working on gaze stabilization with balance challenges, turns, head nods/turns, stairs; further assess L hip if it continues to be a problem   Janene Harvey, PT, DPT 06/09/22 2:50 PM  [place] at [facility] [address] [place],  [address] Phone # [phone] Fax # [phone]

## 2022-06-09 ENCOUNTER — Encounter: Payer: Self-pay | Admitting: Physical Therapy

## 2022-06-09 ENCOUNTER — Ambulatory Visit: Payer: Medicare PPO | Attending: Otolaryngology | Admitting: Physical Therapy

## 2022-06-09 ENCOUNTER — Other Ambulatory Visit: Payer: Self-pay

## 2022-06-09 DIAGNOSIS — R2681 Unsteadiness on feet: Secondary | ICD-10-CM | POA: Insufficient documentation

## 2022-06-09 DIAGNOSIS — R42 Dizziness and giddiness: Secondary | ICD-10-CM | POA: Diagnosis not present

## 2022-06-09 DIAGNOSIS — M25552 Pain in left hip: Secondary | ICD-10-CM | POA: Diagnosis not present

## 2022-06-09 DIAGNOSIS — R2689 Other abnormalities of gait and mobility: Secondary | ICD-10-CM | POA: Insufficient documentation

## 2022-06-10 NOTE — Therapy (Signed)
OUTPATIENT PHYSICAL THERAPY VESTIBULAR TREATMENT     Patient Name: Cathy Montoya MRN: [id number] DOB:05/17/1946, 76 y.o., female Today's Date: 06/11/2022   PT End of Session - 06/11/22 0846     Visit Number 2    Number of Visits 13    Date for PT Re-Evaluation 07/21/22    Authorization Type [employer]    Authorization Time Period approved 13 PT visits from 06/09/2022-07/21/2022    Authorization - Visit Number 2    Authorization - Number of Visits 13    PT Start Time 5009    PT Stop Time 0927    PT Time Calculation (min) 40 min    Equipment Utilized During Treatment Gait belt    Activity Tolerance Patient tolerated treatment well    Behavior During Therapy WFL for tasks assessed/performed              Past Medical History:  Diagnosis Date   Abnormal Pap smear    Acne rosacea    Alopecia    frontal   Arthritis    knees, lt  hip, lower back, hands, shoulders   Breast cancer, right ([place]) 04/11/2004   invasive lobular carcinoma treated with 1 chemo treatment and radiation   Cat allergies    History of kidney stones    Intermittent vertigo    FRONTAL FIBROSIS ALOPECIA   Lipoma    Paroxysmal atrial fibrillation (HCC)    Shoulder pain, left    Typical atrial flutter ([place])    Past Surgical History:  Procedure Laterality Date   A-FLUTTER ABLATION N/A 04/21/2018   Procedure: A-FLUTTER ABLATION;  Surgeon: Thompson Grayer, MD;  Location: [facility];  Service: Cardiovascular;  Laterality: N/A;   ATRIAL FIBRILLATION ABLATION N/A 04/18/2019   Procedure: ATRIAL FIBRILLATION ABLATION;  Surgeon: Thompson Grayer, MD;  Location: [facility];  Service: Cardiovascular;  Laterality: N/A;   BREAST LUMPECTOMY  01/10/2004   lymph node, chemo x 1/radiation   COLONOSCOPY     CYSTOSCOPY/URETEROSCOPY/HOLMIUM LASER/STENT PLACEMENT Bilateral 10/21/2018   Procedure: CYSTOSCOPY/BILATERAL RETROGRADE PYELOGRAM / BILATERAL URETEROSCOPY/ BILATERAL STENT PLACEMENT/BILATERAL STONE  BASKETING;  Surgeon: Lucas Mallow, MD;  Location: [facility];  Service: Urology;  Laterality: Bilateral;   GYNECOLOGIC CRYOSURGERY  1988   cervical dysplasia   WRIST SURGERY Left 08/23/2019   Dr. Karren Cobble   Patient Active Problem List   Diagnosis Date Noted   Paroxysmal atrial fibrillation ([place]) 05/17/2019   Typical atrial flutter ([place])    Left hip pain    Breast cancer, right ([place]) 07/27/2012    PCP: Harlan Stains, MD REFERRING PROVIDER: Leta Baptist, MD  REFERRING DIAG: Dizziness  THERAPY DIAG:  Unsteadiness on feet  Dizziness and giddiness  Other abnormalities of gait and mobility  Pain in left hip  ONSET DATE: couple years   Rationale for Evaluation and Treatment Rehabilitation  SUBJECTIVE:   SUBJECTIVE STATEMENT: Tried the HEP and some of the exercises were easy.   Pt accompanied by: self  PERTINENT HISTORY: R breast CA s/p lumpectomy, chemo, radiation, a-fib and a-flutter, L wrist surgery  PAIN:  Are you having pain? Yes: NPRS scale: 2/10 Pain location: L hip and knee Pain description: ache Aggravating factors: standing up from a chair Relieving factors: exercises  PRECAUTIONS: Fall  WEIGHT BEARING RESTRICTIONS: No  FALLS: Has patient fallen in last 6 months? No  PATIENT GOALS: improve balance   OBJECTIVE:      TODAY'S TREATMENT: 06/11/22 Activity Comments  review HEP:  standing horizontal VOR 30" romberg EC 30" romberg EO foam 1/2 turns in doorway No wooziness; mild sway with EC and on foam; encouraged quicker turns with fewer steps with turns- mod imbalance   Romberg horizontal VOR 30" Mild instability; no dizziness; cueing to reduce amplitude   marching + head nods/turns to targets 3x30" Cueing to maintain rhythm with feet before adding head turns; LOB with R head turn  tandem walk along counter top Weaning UE support  backwards walk along counter top  Cueing for increased R step length and wider BOS  alt toe tap on cone  Cueing to  shift onto standing LE and slow down; light UE support required      HOME EXERCISE PROGRAM Last updated: 06/11/22 Access Code: [email or web address] URL: [email or web address] Date: 06/11/2022 Prepared by: [facility]  Exercises - Standing Gaze Stabilization with Head Rotation  - 1 x daily - 5 x weekly - 2 sets - 30 sec hold - Romberg Stance with Eyes Closed  - 1 x daily - 5 x weekly - 2 sets - 30 sec hold - Romberg Stance on Foam Pad  - 1 x daily - 5 x weekly - 2 sets - 30 sec hold - 180 Degree Pivot Turn with Single Point Cane  - 1 x daily - 5 x weekly - 2 sets - 10 reps - Standing Toe Taps  - 1 x daily - 5 x weekly - 2 sets - 20 reps   PATIENT EDUCATION: Education details: HEP update and review Person educated: Patient Education method: Explanation, Demonstration, Tactile cues, Verbal cues, and Handouts Education comprehension: verbalized understanding and returned demonstration     Below measures were taken at time of initial evaluation unless otherwise specified:   DIAGNOSTIC FINDINGS: recent MRI revealed R vestibular schwannoma   COGNITION: Overall cognitive status: Within functional limits for tasks assessed   SENSATION: WFL   POSTURE:  rounded shoulders, forward head, and increased thoracic kyphosis  GAIT: Gait pattern:  Antalgia on L LE and slightly shortened step length  Assistive device utilized: None Level of assistance: Complete Independence    PATIENT SURVEYS:  FOTO NT- patient denies dizziness   VESTIBULAR ASSESSMENT:  GENERAL OBSERVATION: wears progressive lenses most of the time    OCULOMOTOR EXAM:  Ocular Alignment:  R eye esotropia  Ocular ROM: No Limitations  Spontaneous Nystagmus: absent  Gaze-Induced Nystagmus: absent  Smooth Pursuits: intact  Saccades: intact   VESTIBULAR - OCULAR REFLEX:   Slow VOR: Comment: slow and slightly woozy horizontal; intact vertical   VOR Cancellation:  Normal  Head-Impulse Test: HIT Right: positive HIT Left: positive      POSITIONAL TESTING:  Right Sidelying: negative; no dizziness Left Sidelying: negative; no dizziness    M-CTSIB  Condition 1: Firm Surface, EO 30 Sec, Normal Sway  Condition 2: Firm Surface, EC 30 Sec, Mild and Moderate Sway  Condition 3: Foam Surface, EO 30 Sec, Mild and Moderate Sway  Condition 4: Foam Surface, EC 20 Sec, Severe Sway         VESTIBULAR TREATMENT:  DATE: 06/09/22   PATIENT EDUCATION: Education details: prognosis, POC, HEP; answering pt's questions on benefit of vestibular rehab for existing schwannoma  Person educated: Patient Education method: Explanation, Demonstration, Tactile cues, Verbal cues, and Handouts Education comprehension: verbalized understanding and returned demonstration  HOME EXERCISE PROGRAM: Access Code: [email or web address] URL: [email or web address] Date: 06/09/2022 Prepared by: [facility]  Exercises - Standing Gaze Stabilization with Head Rotation  - 1 x daily - 5 x weekly - 2 sets - 30 sec hold - Romberg Stance with Eyes Closed  - 1 x daily - 5 x weekly - 2 sets - 30 sec hold - Romberg Stance on Foam Pad  - 1 x daily - 5 x weekly - 2 sets - 30 sec hold - 180 Degree Pivot Turn with Single Point Cane  - 1 x daily - 5 x weekly - 2 sets - 10 reps   GOALS: Goals reviewed with patient? Yes  SHORT TERM GOALS: Target date: 06/30/2022  Patient to be independent with initial HEP. Baseline: HEP initiated Goal status: IN PROGRESS    LONG TERM GOALS: Target date: 07/21/2022  Patient to be independent with advanced HEP. Baseline: Not yet initiated  Goal status: IN PROGRESS  Patient to report 0/10 dizziness with standing vertical and horizontal VOR for 30 seconds. Baseline: symptomatic Goal status: IN PROGRESS  Patient to  demonstrate moderate sway with M-CTSIB condition with eyes closed/foam surface in order to improve safety in environments with uneven surfaces and dim lighting. Baseline: severe Goal status: IN PROGRESS  Patient to score at least 20/24 on DGI in order to decrease risk of falls. Baseline: 16/24 Goal status: IN PROGRESS  Patient to demonstrate safe alternating reciprocal stair navigation while holding object in hands with good stability.  Baseline: notes difficulty Goal status: IN PROGRESS   ASSESSMENT:  CLINICAL IMPRESSION: Patient arrived to session with report of HEP compliance. Reviewed HEP for max understanding and carryover; patient required adjustments to increase challenge. Most difficulty evident with turning activities today. Some difficulty with coordination and dual tasking activities evident with other balance challenges. Updated HEP with SLS balance activity which was well-tolerated today. Patient reported understanding and without complaints at end of session.   OBJECTIVE IMPAIRMENTS: Abnormal gait, decreased activity tolerance, decreased balance, difficulty walking, decreased ROM, decreased strength, dizziness, improper body mechanics, postural dysfunction, and pain.   ACTIVITY LIMITATIONS: carrying, lifting, bending, standing, squatting, stairs, transfers, bed mobility, bathing, toileting, dressing, reach over head, and hygiene/grooming  PARTICIPATION LIMITATIONS: meal prep, cleaning, laundry, driving, shopping, community activity, yard work, and church  PERSONAL FACTORS: Age, Past/current experiences, Time since onset of injury/illness/exacerbation, and 3+ comorbidities: R breast CA s/p lumpectomy, chemo, radiation, a-fib and a-flutter, L wrist surgery  are also affecting patient's functional outcome.   REHAB POTENTIAL: Good  CLINICAL DECISION MAKING: Evolving/moderate complexity  EVALUATION COMPLEXITY: Moderate   PLAN: PT FREQUENCY: 1-2x/week  PT DURATION: 6  weeks  PLANNED INTERVENTIONS: Therapeutic exercises, Therapeutic activity, Neuromuscular re-education, Balance training, Gait training, Patient/Family education, Self Care, Joint mobilization, Stair training, Vestibular training, Canalith repositioning, DME instructions, Aquatic Therapy, Dry Needling, Electrical stimulation, Cryotherapy, Moist heat, Taping, Manual therapy, and Re-evaluation  PLAN FOR NEXT SESSION: continue working on gaze stabilization with balance challenges, turns, head nods/turns, stairs; further assess L hip if it continues to be a problem   Janene Harvey, PT, DPT 06/11/22 9:29 AM  [place] at [facility] [address], [place], [place] [address]  Phone # [phone] Fax # [phone]

## 2022-06-11 ENCOUNTER — Ambulatory Visit: Payer: Medicare PPO | Admitting: Physical Therapy

## 2022-06-11 ENCOUNTER — Encounter: Payer: Self-pay | Admitting: Physical Therapy

## 2022-06-11 DIAGNOSIS — R2681 Unsteadiness on feet: Secondary | ICD-10-CM | POA: Diagnosis not present

## 2022-06-11 DIAGNOSIS — R42 Dizziness and giddiness: Secondary | ICD-10-CM

## 2022-06-11 DIAGNOSIS — R2689 Other abnormalities of gait and mobility: Secondary | ICD-10-CM

## 2022-06-11 DIAGNOSIS — M25552 Pain in left hip: Secondary | ICD-10-CM

## 2022-06-15 ENCOUNTER — Ambulatory Visit
Admission: RE | Admit: 2022-06-15 | Discharge: 2022-06-15 | Disposition: A | Discharge status: Home / Self Care | Payer: Medicare PPO | Source: Ambulatory Visit | Attending: Family Medicine | Admitting: Family Medicine

## 2022-06-15 DIAGNOSIS — M8580 Other specified disorders of bone density and structure, unspecified site: Secondary | ICD-10-CM

## 2022-06-15 DIAGNOSIS — Z780 Asymptomatic menopausal state: Secondary | ICD-10-CM | POA: Diagnosis not present

## 2022-06-15 DIAGNOSIS — M85851 Other specified disorders of bone density and structure, right thigh: Secondary | ICD-10-CM | POA: Diagnosis not present

## 2022-06-17 ENCOUNTER — Ambulatory Visit: Payer: Medicare PPO | Admitting: Physical Therapy

## 2022-06-19 ENCOUNTER — Ambulatory Visit: Payer: Medicare PPO | Attending: Otolaryngology

## 2022-06-19 DIAGNOSIS — R42 Dizziness and giddiness: Secondary | ICD-10-CM | POA: Diagnosis not present

## 2022-06-19 DIAGNOSIS — R2689 Other abnormalities of gait and mobility: Secondary | ICD-10-CM | POA: Diagnosis not present

## 2022-06-19 DIAGNOSIS — R2681 Unsteadiness on feet: Secondary | ICD-10-CM | POA: Diagnosis not present

## 2022-06-19 DIAGNOSIS — M25552 Pain in left hip: Secondary | ICD-10-CM | POA: Insufficient documentation

## 2022-06-19 NOTE — Therapy (Signed)
OUTPATIENT PHYSICAL THERAPY VESTIBULAR TREATMENT     Patient Name: Cathy Montoya MRN: [id number] DOB:08-01-1946, 76 y.o., female Today's Date: 06/19/2022   PT End of Session - 06/19/22 0846     Visit Number 3    Number of Visits 13    Date for PT Re-Evaluation 07/21/22    Authorization Type [employer]    Authorization Time Period approved 13 PT visits from 06/09/2022-07/21/2022    Authorization - Visit Number 3    Authorization - Number of Visits 13    PT Start Time 0845    PT Stop Time 0930    PT Time Calculation (min) 45 min    Equipment Utilized During Treatment Gait belt    Activity Tolerance Patient tolerated treatment well    Behavior During Therapy WFL for tasks assessed/performed              Past Medical History:  Diagnosis Date   Abnormal Pap smear    Acne rosacea    Alopecia    frontal   Arthritis    knees, lt  hip, lower back, hands, shoulders   Breast cancer, right ([facility]) 04/11/2004   invasive lobular carcinoma treated with 1 chemo treatment and radiation   Cat allergies    History of kidney stones    Intermittent vertigo    FRONTAL FIBROSIS ALOPECIA   Lipoma    Paroxysmal atrial fibrillation (H[facility])    Shoulder pain, left    Typical atrial flutter ([place])    Past Surgical History:  Procedure Laterality Date   A-FLUTTER ABLATION N/A 04/21/2018   Procedure: A-FLUTTER ABLATION;  Surgeon: Thompson Grayer, MD;  Location: [facility];  Service: Cardiovascular;  Laterality: N/A;   ATRIAL FIBRILLATION ABLATION N/A 04/18/2019   Procedure: ATRIAL FIBRILLATION ABLATION;  Surgeon: Thompson Grayer, MD;  Location: [facility];  Service: Cardiovascular;  Laterality: N/A;   BREAST LUMPECTOMY  01/10/2004   lymph node, chemo x 1/radiation   COLONOSCOPY     CYSTOSCOPY/URETEROSCOPY/HOLMIUM LASER/STENT PLACEMENT Bilateral 10/21/2018   Procedure: CYSTOSCOPY/BILATERAL RETROGRADE PYELOGRAM / BILATERAL URETEROSCOPY/ BILATERAL STENT PLACEMENT/BILATERAL STONE  BASKETING;  Surgeon: Lucas Mallow, MD;  Location: [facility];  Service: Urology;  Laterality: Bilateral;   GYNECOLOGIC CRYOSURGERY  1988   cervical dysplasia   WRIST SURGERY Left 08/23/2019   Dr. Karren Cobble   Patient Active Problem List   Diagnosis Date Noted   Paroxysmal atrial fibrillation () 05/17/2019   Typical atrial flutter ([place])    Left hip pain    Breast cancer, right ([facility]) 07/27/2012    PCP: Harlan Stains, MD REFERRING PROVIDER: Leta Baptist, MD  REFERRING DIAG: Dizziness  THERAPY DIAG:  Unsteadiness on feet  Dizziness and giddiness  Other abnormalities of gait and mobility  Pain in left hip  ONSET DATE: couple years   Rationale for Evaluation and Treatment Rehabilitation  SUBJECTIVE:   SUBJECTIVE STATEMENT: Pt notes her left hip is painful and bothersome. Pt reports non-compliance with majority of HEP  Pt accompanied by: self  PERTINENT HISTORY: R breast CA s/p lumpectomy, chemo, radiation, a-fib and a-flutter, L wrist surgery  PAIN:  Are you having pain? Yes: NPRS scale: 2/10 Pain location: L hip and knee Pain description: ache Aggravating factors: standing up from a chair Relieving factors: exercises  PRECAUTIONS: Fall  WEIGHT BEARING RESTRICTIONS: No  FALLS: Has patient fallen in last 6 months? No  PATIENT GOALS: improve balance   OBJECTIVE:    TODAY'S TREATMENT: 06/19/22 Activity Comments  Standing  VOR x 1 2x30 sec  Standing VOR x 1 Near/far target switch Q 15 sec for 1 min  Romberg EO/EC on firm/foam X30 sec w/ mild sway -Head turns EO/EC 5x--frequent LOB with eyes closed conditions   Pivot-turns   Alt stair taps 3x10  Alt cone taps 3x10 light UE touch      TODAY'S TREATMENT: 06/11/22 Activity Comments  review HEP:   standing horizontal VOR 30" romberg EC 30" romberg EO foam 1/2 turns in doorway No wooziness; mild sway with EC and on foam; encouraged quicker turns with fewer steps with turns- mod imbalance   Romberg  horizontal VOR 30" Mild instability; no dizziness; cueing to reduce amplitude   marching + head nods/turns to targets 3x30" Cueing to maintain rhythm with feet before adding head turns; LOB with R head turn  tandem walk along counter top Weaning UE support  backwards walk along counter top  Cueing for increased R step length and wider BOS  alt toe tap on cone  Cueing to shift onto standing LE and slow down; light UE support required      HOME EXERCISE PROGRAM Last updated: 06/11/22 Access Code: [email or web address] URL: [email or web address] Date: 06/11/2022 Prepared by: [facility]  Exercises - Standing Gaze Stabilization with Head Rotation  - 1 x daily - 5 x weekly - 2 sets - 30 sec hold - Romberg Stance with Eyes Closed  - 1 x daily - 5 x weekly - 2 sets - 30 sec hold - Romberg Stance on Foam Pad  - 1 x daily - 5 x weekly - 2 sets - 30 sec hold - 180 Degree Pivot Turn with Single Point Cane  - 1 x daily - 5 x weekly - 2 sets - 10 reps - Standing Toe Taps  - 1 x daily - 5 x weekly - 2 sets - 20 reps - Corner Balance Feet Together: Eyes Open With Head Turns  - 1 x daily - 7 x weekly - 2 sets - 5 reps - Corner Balance Feet Together: Eyes Closed With Head Turns  - 1 x daily - 7 x weekly - 2 sets - 5 reps  PATIENT EDUCATION: Education details: HEP update and review Person educated: Patient Education method: Explanation, Demonstration, Tactile cues, Verbal cues, and Handouts Education comprehension: verbalized understanding and returned demonstration     Below measures were taken at time of initial evaluation unless otherwise specified:   DIAGNOSTIC FINDINGS: recent MRI revealed R vestibular schwannoma   COGNITION: Overall cognitive status: Within functional limits for tasks assessed   SENSATION: WFL   POSTURE:  rounded shoulders, forward head, and increased thoracic kyphosis  GAIT: Gait pattern:  Antalgia on L LE and slightly  shortened step length  Assistive device utilized: None Level of assistance: Complete Independence    PATIENT SURVEYS:  FOTO NT- patient denies dizziness   VESTIBULAR ASSESSMENT:  GENERAL OBSERVATION: wears progressive lenses most of the time    OCULOMOTOR EXAM:  Ocular Alignment:  R eye esotropia  Ocular ROM: No Limitations  Spontaneous Nystagmus: absent  Gaze-Induced Nystagmus: absent  Smooth Pursuits: intact  Saccades: intact   VESTIBULAR - OCULAR REFLEX:   Slow VOR: Comment: slow and slightly woozy horizontal; intact vertical   VOR Cancellation: Normal  Head-Impulse Test: HIT Right: positive HIT Left: positive      POSITIONAL TESTING:  Right Sidelying: negative; no dizziness Left Sidelying: negative; no dizziness    M-CTSIB  Condition 1:  Firm Surface, EO 30 Sec, Normal Sway  Condition 2: Firm Surface, EC 30 Sec, Mild and Moderate Sway  Condition 3: Foam Surface, EO 30 Sec, Mild and Moderate Sway  Condition 4: Foam Surface, EC 20 Sec, Severe Sway         VESTIBULAR TREATMENT:                                                                                                  DATE: 06/09/22   PATIENT EDUCATION: Education details: prognosis, POC, HEP; answering pt's questions on benefit of vestibular rehab for existing schwannoma  Person educated: Patient Education method: Explanation, Demonstration, Tactile cues, Verbal cues, and Handouts Education comprehension: verbalized understanding and returned demonstration  HOME EXERCISE PROGRAM: Access Code: [email or web address] URL: [email or web address] Date: 06/09/2022 Prepared by: [facility]  Exercises - Standing Gaze Stabilization with Head Rotation  - 1 x daily - 5 x weekly - 2 sets - 30 sec hold - Romberg Stance with Eyes Closed  - 1 x daily - 5 x weekly - 2 sets - 30 sec hold - Romberg Stance on Foam Pad  - 1 x daily - 5 x weekly - 2 sets - 30 sec hold - 180  Degree Pivot Turn with Single Point Cane  - 1 x daily - 5 x weekly - 2 sets - 10 reps   GOALS: Goals reviewed with patient? Yes  SHORT TERM GOALS: Target date: 06/30/2022  Patient to be independent with initial HEP. Baseline: HEP initiated Goal status: IN PROGRESS    LONG TERM GOALS: Target date: 07/21/2022  Patient to be independent with advanced HEP. Baseline: Not yet initiated  Goal status: IN PROGRESS  Patient to report 0/10 dizziness with standing vertical and horizontal VOR for 30 seconds. Baseline: symptomatic Goal status: IN PROGRESS  Patient to demonstrate moderate sway with M-CTSIB condition with eyes closed/foam surface in order to improve safety in environments with uneven surfaces and dim lighting. Baseline: severe Goal status: IN PROGRESS  Patient to score at least 20/24 on DGI in order to decrease risk of falls. Baseline: 16/24 Goal status: IN PROGRESS  Patient to demonstrate safe alternating reciprocal stair navigation while holding object in hands with good stability.  Baseline: notes difficulty Goal status: IN PROGRESS   ASSESSMENT:  CLINICAL IMPRESSION: Demonstrates some irregularities with VOR in horizontal but pt reports no disturbance with this movement. Imposed near and far targets for VOR and pt reports difficulty transitioning from target to target. Balance disturbance most pronounced with compliant surfaces and eyes closed conditions and more so with head movements incorporated.  Addition of activities for HEP performance  OBJECTIVE IMPAIRMENTS: Abnormal gait, decreased activity tolerance, decreased balance, difficulty walking, decreased ROM, decreased strength, dizziness, improper body mechanics, postural dysfunction, and pain.   ACTIVITY LIMITATIONS: carrying, lifting, bending, standing, squatting, stairs, transfers, bed mobility, bathing, toileting, dressing, reach over head, and hygiene/grooming  PARTICIPATION LIMITATIONS: meal prep,  cleaning, laundry, driving, shopping, community activity, yard work, and church  PERSONAL FACTORS: Age, Past/current experiences, Time since onset of injury/illness/exacerbation, and 3+ comorbidities:  R breast CA s/p lumpectomy, chemo, radiation, a-fib and a-flutter, L wrist surgery  are also affecting patient's functional outcome.   REHAB POTENTIAL: Good  CLINICAL DECISION MAKING: Evolving/moderate complexity  EVALUATION COMPLEXITY: Moderate   PLAN: PT FREQUENCY: 1-2x/week  PT DURATION: 6 weeks  PLANNED INTERVENTIONS: Therapeutic exercises, Therapeutic activity, Neuromuscular re-education, Balance training, Gait training, Patient/Family education, Self Care, Joint mobilization, Stair training, Vestibular training, Canalith repositioning, DME instructions, Aquatic Therapy, Dry Needling, Electrical stimulation, Cryotherapy, Moist heat, Taping, Manual therapy, and Re-evaluation  PLAN FOR NEXT SESSION: continue working on gaze stabilization with balance challenges, turns, head nods/turns, stairs; further assess L hip if it continues to be a problem   10:29 AM, 06/19/22 M. Sherlyn Lees, PT, DPT Physical Therapist- [place] Office Number: [phone]   [place] [facility] [address], [place], [place] [address] Phone # [phone] Fax # [phone]

## 2022-06-22 ENCOUNTER — Other Ambulatory Visit: Payer: Self-pay | Admitting: Family Medicine

## 2022-06-22 DIAGNOSIS — Z1231 Encounter for screening mammogram for malignant neoplasm of breast: Secondary | ICD-10-CM

## 2022-06-22 NOTE — Therapy (Signed)
OUTPATIENT PHYSICAL THERAPY VESTIBULAR TREATMENT     Patient Name: Cathy Montoya MRN: [id number] DOB:1946-04-06, 76 y.o., female Today's Date: 06/23/2022   PT End of Session - 06/23/22 1143     Visit Number 4    Number of Visits 13    Date for PT Re-Evaluation 07/21/22    Authorization Type [employer]    Authorization Time Period approved 13 PT visits from 06/09/2022-07/21/2022    Authorization - Visit Number 4    Authorization - Number of Visits 13    PT Start Time 4098    PT Stop Time 1143    PT Time Calculation (min) 40 min    Activity Tolerance Patient tolerated treatment well    Behavior During Therapy WFL for tasks assessed/performed               Past Medical History:  Diagnosis Date   Abnormal Pap smear    Acne rosacea    Alopecia    frontal   Arthritis    knees, lt  hip, lower back, hands, shoulders   Breast cancer, right ([place]) 04/11/2004   invasive lobular carcinoma treated with 1 chemo treatment and radiation   Cat allergies    History of kidney stones    Intermittent vertigo    FRONTAL FIBROSIS ALOPECIA   Lipoma    Paroxysmal atrial fibrillation ([facility])    Shoulder pain, left    Typical atrial flutter ([place])    Past Surgical History:  Procedure Laterality Date   A-FLUTTER ABLATION N/A 04/21/2018   Procedure: A-FLUTTER ABLATION;  Surgeon: Thompson Grayer, MD;  Location: [facility];  Service: Cardiovascular;  Laterality: N/A;   ATRIAL FIBRILLATION ABLATION N/A 04/18/2019   Procedure: ATRIAL FIBRILLATION ABLATION;  Surgeon: Thompson Grayer, MD;  Location: [facility];  Service: Cardiovascular;  Laterality: N/A;   BREAST LUMPECTOMY  01/10/2004   lymph node, chemo x 1/radiation   COLONOSCOPY     CYSTOSCOPY/URETEROSCOPY/HOLMIUM LASER/STENT PLACEMENT Bilateral 10/21/2018   Procedure: CYSTOSCOPY/BILATERAL RETROGRADE PYELOGRAM / BILATERAL URETEROSCOPY/ BILATERAL STENT PLACEMENT/BILATERAL STONE BASKETING;  Surgeon: Lucas Mallow, MD;   Location[facility];  Service: Urology;  Laterality: Bilateral;   GYNECOLOGIC CRYOSURGERY  1988   cervical dysplasia   WRIST SURGERY Left 08/23/2019   Dr. Karren Cobble   Patient Active Problem List   Diagnosis Date Noted   Paroxysmal atrial fibrillation ([place]) 05/17/2019   Typical atrial flutter ([place])    Left hip pain    Breast cancer, right ([place]) 07/27/2012    PCP: Harlan Stains, MD REFERRING PROVIDER: Leta Baptist, MD  REFERRING DIAG: Dizziness  THERAPY DIAG:  Unsteadiness on feet  Dizziness and giddiness  Other abnormalities of gait and mobility  Pain in left hip  ONSET DATE: couple years   Rationale for Evaluation and Treatment Rehabilitation  SUBJECTIVE:   SUBJECTIVE STATEMENT: L hip has been hurting more- reports it is d/t not doing her HEP like she should.   Pt accompanied by: self  PERTINENT HISTORY: R breast CA s/p lumpectomy, chemo, radiation, a-fib and a-flutter, L wrist surgery  PAIN:  Are you having pain? Yes: NPRS scale: 5/10 Pain location: L hip Pain description: ache Aggravating factors: standing up from a chair Relieving factors: exercises  PRECAUTIONS: Fall  WEIGHT BEARING RESTRICTIONS: No  FALLS: Has patient fallen in last 6 months? No  PATIENT GOALS: improve balance   OBJECTIVE:      TODAY'S TREATMENT: 06/23/22 Activity Comments  Standing romberg head turns/nods EO and EC  Good speed and stability with EO, more instability EC  1/2 turns in doorway with target  Cueing for increased step length/height; hesitancy and imbalance evident   Standing VOR horizontal Good stability   fwd/back stepping on foam 10x each  1 UE support; cueing for increased step length   wall bumps shoulder and hip (EO/EC/firm) Increased speed and lacking control, especially with shoulder strategy   sidestepping over hurdle  B UE support; c/o difficulty taking large step d/t L hip  1 foot on step + alt shoulder flexion 10x each LE Mild ankle instability            PATIENT EDUCATION: Education details: encouraged patient to bring in past PT HEP for hip for possible consolidation; review and answering patient's questions on HEP Person educated: Patient Education method: Explanation, Demonstration, Tactile cues, Verbal cues, and Handouts Education comprehension: verbalized understanding and returned demonstration   HOME EXERCISE PROGRAM Last updated: 06/11/22 Access Code: [email or web address] URL: [email or web address] Date: 06/11/2022 Prepared by: [facility]  Exercises - Standing Gaze Stabilization with Head Rotation  - 1 x daily - 5 x weekly - 2 sets - 30 sec hold - Romberg Stance with Eyes Closed  - 1 x daily - 5 x weekly - 2 sets - 30 sec hold - Romberg Stance on Foam Pad  - 1 x daily - 5 x weekly - 2 sets - 30 sec hold - 180 Degree Pivot Turn with Single Point Cane  - 1 x daily - 5 x weekly - 2 sets - 10 reps - Standing Toe Taps  - 1 x daily - 5 x weekly - 2 sets - 20 reps - Corner Balance Feet Together: Eyes Open With Head Turns  - 1 x daily - 7 x weekly - 2 sets - 5 reps - Corner Balance Feet Together: Eyes Closed With Head Turns  - 1 x daily - 7 x weekly - 2 sets - 5 reps    Below measures were taken at time of initial evaluation unless otherwise specified:   DIAGNOSTIC FINDINGS: recent MRI revealed R vestibular schwannoma   COGNITION: Overall cognitive status: Within functional limits for tasks assessed   SENSATION: WFL   POSTURE:  rounded shoulders, forward head, and increased thoracic kyphosis  GAIT: Gait pattern:  Antalgia on L LE and slightly shortened step length  Assistive device utilized: None Level of assistance: Complete Independence    PATIENT SURVEYS:  FOTO NT- patient denies dizziness   VESTIBULAR ASSESSMENT:  GENERAL OBSERVATION: wears progressive lenses most of the time    OCULOMOTOR EXAM:  Ocular Alignment:  R eye esotropia  Ocular ROM: No  Limitations  Spontaneous Nystagmus: absent  Gaze-Induced Nystagmus: absent  Smooth Pursuits: intact  Saccades: intact   VESTIBULAR - OCULAR REFLEX:   Slow VOR: Comment: slow and slightly woozy horizontal; intact vertical   VOR Cancellation: Normal  Head-Impulse Test: HIT Right: positive HIT Left: positive      POSITIONAL TESTING:  Right Sidelying: negative; no dizziness Left Sidelying: negative; no dizziness    M-CTSIB  Condition 1: Firm Surface, EO 30 Sec, Normal Sway  Condition 2: Firm Surface, EC 30 Sec, Mild and Moderate Sway  Condition 3: Foam Surface, EO 30 Sec, Mild and Moderate Sway  Condition 4: Foam Surface, EC 20 Sec, Severe Sway         VESTIBULAR TREATMENT:  DATE: 06/09/22   PATIENT EDUCATION: Education details: prognosis, POC, HEP; answering pt's questions on benefit of vestibular rehab for existing schwannoma  Person educated: Patient Education method: Explanation, Demonstration, Tactile cues, Verbal cues, and Handouts Education comprehension: verbalized understanding and returned demonstration  HOME EXERCISE PROGRAM: Access Code: [email or web address] URL: [email or web address] Date: 06/09/2022 Prepared by: [facility]  Exercises - Standing Gaze Stabilization with Head Rotation  - 1 x daily - 5 x weekly - 2 sets - 30 sec hold - Romberg Stance with Eyes Closed  - 1 x daily - 5 x weekly - 2 sets - 30 sec hold - Romberg Stance on Foam Pad  - 1 x daily - 5 x weekly - 2 sets - 30 sec hold - 180 Degree Pivot Turn with Single Point Cane  - 1 x daily - 5 x weekly - 2 sets - 10 reps   GOALS: Goals reviewed with patient? Yes  SHORT TERM GOALS: Target date: 06/30/2022  Patient to be independent with initial HEP. Baseline: HEP initiated Goal status: IN PROGRESS    LONG TERM GOALS: Target date: 07/21/2022  Patient to  be independent with advanced HEP. Baseline: Not yet initiated  Goal status: IN PROGRESS  Patient to report 0/10 dizziness with standing vertical and horizontal VOR for 30 seconds. Baseline: symptomatic Goal status: IN PROGRESS  Patient to demonstrate moderate sway with M-CTSIB condition with eyes closed/foam surface in order to improve safety in environments with uneven surfaces and dim lighting. Baseline: severe Goal status: IN PROGRESS  Patient to score at least 20/24 on DGI in order to decrease risk of falls. Baseline: 16/24 Goal status: IN PROGRESS  Patient to demonstrate safe alternating reciprocal stair navigation while holding object in hands with good stability.  Baseline: notes difficulty Goal status: IN PROGRESS   ASSESSMENT:  CLINICAL IMPRESSION: Patient arrived to session with report of worsening L hip pain, however continues to admit to past PT HEP compliance. Declined hip assessment today.  Took time to review and provide additional written cues to HEP for max understanding and carryover. Patient still with imbalance and hesitancy with turning activities. Also with some hesitancy with stepping tasks, requiring increased step length. Patient tolerated session well and without complaints at end of session.   OBJECTIVE IMPAIRMENTS: Abnormal gait, decreased activity tolerance, decreased balance, difficulty walking, decreased ROM, decreased strength, dizziness, improper body mechanics, postural dysfunction, and pain.   ACTIVITY LIMITATIONS: carrying, lifting, bending, standing, squatting, stairs, transfers, bed mobility, bathing, toileting, dressing, reach over head, and hygiene/grooming  PARTICIPATION LIMITATIONS: meal prep, cleaning, laundry, driving, shopping, community activity, yard work, and church  PERSONAL FACTORS: Age, Past/current experiences, Time since onset of injury/illness/exacerbation, and 3+ comorbidities: R breast CA s/p lumpectomy, chemo, radiation, a-fib  and a-flutter, L wrist surgery  are also affecting patient's functional outcome.   REHAB POTENTIAL: Good  CLINICAL DECISION MAKING: Evolving/moderate complexity  EVALUATION COMPLEXITY: Moderate   PLAN: PT FREQUENCY: 1-2x/week  PT DURATION: 6 weeks  PLANNED INTERVENTIONS: Therapeutic exercises, Therapeutic activity, Neuromuscular re-education, Balance training, Gait training, Patient/Family education, Self Care, Joint mobilization, Stair training, Vestibular training, Canalith repositioning, DME instructions, Aquatic Therapy, Dry Needling, Electrical stimulation, Cryotherapy, Moist heat, Taping, Manual therapy, and Re-evaluation  PLAN FOR NEXT SESSION: continue working on gaze stabilization with balance challenges, turns, head nods/turns, stairs; further assess L hip if it continues to be a problem   Janene Harvey, PT, DPT 06/23/22 11:47 AM  [facility]  Neuro [address], [place], [place] [address] Phone # [phone] Fax # [phone]

## 2022-06-23 ENCOUNTER — Encounter: Payer: Self-pay | Admitting: Physical Therapy

## 2022-06-23 ENCOUNTER — Ambulatory Visit: Payer: Medicare PPO | Admitting: Physical Therapy

## 2022-06-23 DIAGNOSIS — R42 Dizziness and giddiness: Secondary | ICD-10-CM

## 2022-06-23 DIAGNOSIS — M25552 Pain in left hip: Secondary | ICD-10-CM

## 2022-06-23 DIAGNOSIS — R2681 Unsteadiness on feet: Secondary | ICD-10-CM | POA: Diagnosis not present

## 2022-06-23 DIAGNOSIS — R2689 Other abnormalities of gait and mobility: Secondary | ICD-10-CM | POA: Diagnosis not present

## 2022-06-24 NOTE — Therapy (Signed)
OUTPATIENT PHYSICAL THERAPY VESTIBULAR TREATMENT     Patient Name: Cathy Montoya MRN: [id number] DOB:14-Mar-1946, 76 y.o., female Today's Date: 06/24/2022       Past Medical History:  Diagnosis Date   Abnormal Pap smear    Acne rosacea    Alopecia    frontal   Arthritis    knees, lt  hip, lower back, hands, shoulders   Breast cancer, right ([place]) 04/11/2004   invasive lobular carcinoma treated with 1 chemo treatment and radiation   Cat allergies    History of kidney stones    Intermittent vertigo    FRONTAL FIBROSIS ALOPECIA   Lipoma    Paroxysmal atrial fibrillation ([facility])    Shoulder pain, left    Typical atrial flutter ([place])    Past Surgical History:  Procedure Laterality Date   A-FLUTTER ABLATION N/A 04/21/2018   Procedure: A-FLUTTER ABLATION;  Surgeon: Thompson Grayer, MD;  Location: [facility];  Service: Cardiovascular;  Laterality: N/A;   ATRIAL FIBRILLATION ABLATION N/A 04/18/2019   Procedure: ATRIAL FIBRILLATION ABLATION;  Surgeon: Thompson Grayer, MD;  Location: [facility];  Service: Cardiovascular;  Laterality: N/A;   BREAST LUMPECTOMY  01/10/2004   lymph node, chemo x 1/radiation   COLONOSCOPY     CYSTOSCOPY/URETEROSCOPY/HOLMIUM LASER/STENT PLACEMENT Bilateral 10/21/2018   Procedure: CYSTOSCOPY/BILATERAL RETROGRADE PYELOGRAM / BILATERAL URETEROSCOPY/ BILATERAL STENT PLACEMENT/BILATERAL STONE BASKETING;  Surgeon: Lucas Mallow, MD;  Location: [facility];  Service: Urology;  Laterality: Bilateral;   GYNECOLOGIC CRYOSURGERY  1988   cervical dysplasia   WRIST SURGERY Left 08/23/2019   Dr. Karren Cobble   Patient Active Problem List   Diagnosis Date Noted   Paroxysmal atrial fibrillation ([place]) 05/17/2019   Typical atrial flutter ([place])    Left hip pain    Breast cancer, right ([place]) 07/27/2012    PCP: Harlan Stains, MD REFERRING PROVIDER: Leta Baptist, MD  REFERRING DIAG: Dizziness  THERAPY DIAG:  No diagnosis found.  ONSET DATE: couple years    Rationale for Evaluation and Treatment Rehabilitation  SUBJECTIVE:   SUBJECTIVE STATEMENT: L hip has been hurting more- reports it is d/t not doing her HEP like she should.   Pt accompanied by: self  PERTINENT HISTORY: R breast CA s/p lumpectomy, chemo, radiation, a-fib and a-flutter, L wrist surgery  PAIN:  Are you having pain? Yes: NPRS scale: 5/10 Pain location: L hip Pain description: ache Aggravating factors: standing up from a chair Relieving factors: exercises  PRECAUTIONS: Fall  WEIGHT BEARING RESTRICTIONS: No  FALLS: Has patient fallen in last 6 months? No  PATIENT GOALS: improve balance   OBJECTIVE:     TODAY'S TREATMENT: 06/25/22 Activity Comments                         HOME EXERCISE PROGRAM Last updated: 06/11/22 Access Code: [email or web address] URL: [email or web address] Date: 06/11/2022 Prepared by: [facility]  Exercises - Standing Gaze Stabilization with Head Rotation  - 1 x daily - 5 x weekly - 2 sets - 30 sec hold - Romberg Stance with Eyes Closed  - 1 x daily - 5 x weekly - 2 sets - 30 sec hold - Romberg Stance on Foam Pad  - 1 x daily - 5 x weekly - 2 sets - 30 sec hold - 180 Degree Pivot Turn with Single Point Cane  - 1 x daily - 5 x weekly - 2 sets - 10 reps -  Standing Toe Taps  - 1 x daily - 5 x weekly - 2 sets - 20 reps - Corner Balance Feet Together: Eyes Open With Head Turns  - 1 x daily - 7 x weekly - 2 sets - 5 reps - Corner Balance Feet Together: Eyes Closed With Head Turns  - 1 x daily - 7 x weekly - 2 sets - 5 reps    Below measures were taken at time of initial evaluation unless otherwise specified:   DIAGNOSTIC FINDINGS: recent MRI revealed R vestibular schwannoma   COGNITION: Overall cognitive status: Within functional limits for tasks assessed   SENSATION: WFL   POSTURE:  rounded shoulders, forward head, and increased thoracic kyphosis  GAIT: Gait pattern:   Antalgia on L LE and slightly shortened step length  Assistive device utilized: None Level of assistance: Complete Independence    PATIENT SURVEYS:  FOTO NT- patient denies dizziness   VESTIBULAR ASSESSMENT:  GENERAL OBSERVATION: wears progressive lenses most of the time    OCULOMOTOR EXAM:  Ocular Alignment:  R eye esotropia  Ocular ROM: No Limitations  Spontaneous Nystagmus: absent  Gaze-Induced Nystagmus: absent  Smooth Pursuits: intact  Saccades: intact   VESTIBULAR - OCULAR REFLEX:   Slow VOR: Comment: slow and slightly woozy horizontal; intact vertical   VOR Cancellation: Normal  Head-Impulse Test: HIT Right: positive HIT Left: positive      POSITIONAL TESTING:  Right Sidelying: negative; no dizziness Left Sidelying: negative; no dizziness    M-CTSIB  Condition 1: Firm Surface, EO 30 Sec, Normal Sway  Condition 2: Firm Surface, EC 30 Sec, Mild and Moderate Sway  Condition 3: Foam Surface, EO 30 Sec, Mild and Moderate Sway  Condition 4: Foam Surface, EC 20 Sec, Severe Sway         VESTIBULAR TREATMENT:                                                                                                  DATE: 06/09/22   PATIENT EDUCATION: Education details: prognosis, POC, HEP; answering pt's questions on benefit of vestibular rehab for existing schwannoma  Person educated: Patient Education method: Explanation, Demonstration, Tactile cues, Verbal cues, and Handouts Education comprehension: verbalized understanding and returned demonstration  HOME EXERCISE PROGRAM: Access Code: [email or web address] URL: [email or web address] Date: 06/09/2022 Prepared by: [facility]  Exercises - Standing Gaze Stabilization with Head Rotation  - 1 x daily - 5 x weekly - 2 sets - 30 sec hold - Romberg Stance with Eyes Closed  - 1 x daily - 5 x weekly - 2 sets - 30 sec hold - Romberg Stance on Foam Pad  - 1 x daily - 5 x weekly - 2  sets - 30 sec hold - 180 Degree Pivot Turn with Single Point Cane  - 1 x daily - 5 x weekly - 2 sets - 10 reps   GOALS: Goals reviewed with patient? Yes  SHORT TERM GOALS: Target date: 06/30/2022  Patient to be independent with initial HEP. Baseline: HEP initiated Goal status: IN PROGRESS  LONG TERM GOALS: Target date: 07/21/2022  Patient to be independent with advanced HEP. Baseline: Not yet initiated  Goal status: IN PROGRESS  Patient to report 0/10 dizziness with standing vertical and horizontal VOR for 30 seconds. Baseline: symptomatic Goal status: IN PROGRESS  Patient to demonstrate moderate sway with M-CTSIB condition with eyes closed/foam surface in order to improve safety in environments with uneven surfaces and dim lighting. Baseline: severe Goal status: IN PROGRESS  Patient to score at least 20/24 on DGI in order to decrease risk of falls. Baseline: 16/24 Goal status: IN PROGRESS  Patient to demonstrate safe alternating reciprocal stair navigation while holding object in hands with good stability.  Baseline: notes difficulty Goal status: IN PROGRESS   ASSESSMENT:  CLINICAL IMPRESSION: Patient arrived to session with report of worsening L hip pain, however continues to admit to past PT HEP compliance. Declined hip assessment today.  Took time to review and provide additional written cues to HEP for max understanding and carryover. Patient still with imbalance and hesitancy with turning activities. Also with some hesitancy with stepping tasks, requiring increased step length. Patient tolerated session well and without complaints at end of session.   OBJECTIVE IMPAIRMENTS: Abnormal gait, decreased activity tolerance, decreased balance, difficulty walking, decreased ROM, decreased strength, dizziness, improper body mechanics, postural dysfunction, and pain.   ACTIVITY LIMITATIONS: carrying, lifting, bending, standing, squatting, stairs, transfers, bed mobility,  bathing, toileting, dressing, reach over head, and hygiene/grooming  PARTICIPATION LIMITATIONS: meal prep, cleaning, laundry, driving, shopping, community activity, yard work, and church  PERSONAL FACTORS: Age, Past/current experiences, Time since onset of injury/illness/exacerbation, and 3+ comorbidities: R breast CA s/p lumpectomy, chemo, radiation, a-fib and a-flutter, L wrist surgery  are also affecting patient's functional outcome.   REHAB POTENTIAL: Good  CLINICAL DECISION MAKING: Evolving/moderate complexity  EVALUATION COMPLEXITY: Moderate   PLAN: PT FREQUENCY: 1-2x/week  PT DURATION: 6 weeks  PLANNED INTERVENTIONS: Therapeutic exercises, Therapeutic activity, Neuromuscular re-education, Balance training, Gait training, Patient/Family education, Self Care, Joint mobilization, Stair training, Vestibular training, Canalith repositioning, DME instructions, Aquatic Therapy, Dry Needling, Electrical stimulation, Cryotherapy, Moist heat, Taping, Manual therapy, and Re-evaluation  PLAN FOR NEXT SESSION: continue working on gaze stabilization with balance challenges, turns, head nods/turns, stairs; further assess L hip if it continues to be a problem   Janene Harvey, PT, DPT 06/24/22 10:38 AM  [facility] at [facility] [address] [place], [place] [address] Phone # [phone] Fax # [phone]

## 2022-06-25 ENCOUNTER — Ambulatory Visit: Payer: Medicare PPO | Admitting: Physical Therapy

## 2022-06-25 ENCOUNTER — Encounter: Payer: Self-pay | Admitting: Physical Therapy

## 2022-06-25 DIAGNOSIS — M25552 Pain in left hip: Secondary | ICD-10-CM

## 2022-06-25 DIAGNOSIS — R2689 Other abnormalities of gait and mobility: Secondary | ICD-10-CM

## 2022-06-25 DIAGNOSIS — R2681 Unsteadiness on feet: Secondary | ICD-10-CM

## 2022-06-25 DIAGNOSIS — R42 Dizziness and giddiness: Secondary | ICD-10-CM | POA: Diagnosis not present

## 2022-07-01 DIAGNOSIS — D2262 Melanocytic nevi of left upper limb, including shoulder: Secondary | ICD-10-CM | POA: Diagnosis not present

## 2022-07-01 DIAGNOSIS — L821 Other seborrheic keratosis: Secondary | ICD-10-CM | POA: Diagnosis not present

## 2022-07-01 DIAGNOSIS — D225 Melanocytic nevi of trunk: Secondary | ICD-10-CM | POA: Diagnosis not present

## 2022-07-01 DIAGNOSIS — D2261 Melanocytic nevi of right upper limb, including shoulder: Secondary | ICD-10-CM | POA: Diagnosis not present

## 2022-07-01 DIAGNOSIS — D2272 Melanocytic nevi of left lower limb, including hip: Secondary | ICD-10-CM | POA: Diagnosis not present

## 2022-07-01 DIAGNOSIS — L570 Actinic keratosis: Secondary | ICD-10-CM | POA: Diagnosis not present

## 2022-07-01 DIAGNOSIS — D2239 Melanocytic nevi of other parts of face: Secondary | ICD-10-CM | POA: Diagnosis not present

## 2022-07-01 DIAGNOSIS — Q825 Congenital non-neoplastic nevus: Secondary | ICD-10-CM | POA: Diagnosis not present

## 2022-07-01 DIAGNOSIS — D2271 Melanocytic nevi of right lower limb, including hip: Secondary | ICD-10-CM | POA: Diagnosis not present

## 2022-08-04 DIAGNOSIS — H401132 Primary open-angle glaucoma, bilateral, moderate stage: Secondary | ICD-10-CM | POA: Diagnosis not present

## 2022-08-20 ENCOUNTER — Ambulatory Visit
Admission: RE | Admit: 2022-08-20 | Discharge: 2022-08-20 | Disposition: A | Discharge status: Home / Self Care | Payer: Medicare PPO | Source: Ambulatory Visit | Attending: Family Medicine | Admitting: Family Medicine

## 2022-08-20 DIAGNOSIS — Z1231 Encounter for screening mammogram for malignant neoplasm of breast: Secondary | ICD-10-CM | POA: Diagnosis not present

## 2022-09-02 DIAGNOSIS — M25552 Pain in left hip: Secondary | ICD-10-CM | POA: Diagnosis not present

## 2022-09-03 ENCOUNTER — Other Ambulatory Visit: Payer: Self-pay | Admitting: Sports Medicine

## 2022-09-03 ENCOUNTER — Encounter: Payer: Self-pay | Admitting: Sports Medicine

## 2022-09-03 ENCOUNTER — Ambulatory Visit
Admission: RE | Admit: 2022-09-03 | Discharge: 2022-09-03 | Disposition: A | Discharge status: Home / Self Care | Payer: Medicare PPO | Source: Ambulatory Visit | Attending: Sports Medicine | Admitting: Sports Medicine

## 2022-09-03 DIAGNOSIS — M25552 Pain in left hip: Secondary | ICD-10-CM

## 2022-11-10 DIAGNOSIS — M1612 Unilateral primary osteoarthritis, left hip: Secondary | ICD-10-CM | POA: Diagnosis not present

## 2022-12-02 DIAGNOSIS — M1612 Unilateral primary osteoarthritis, left hip: Secondary | ICD-10-CM | POA: Diagnosis not present

## 2022-12-09 DIAGNOSIS — H903 Sensorineural hearing loss, bilateral: Secondary | ICD-10-CM | POA: Diagnosis not present

## 2022-12-09 DIAGNOSIS — R42 Dizziness and giddiness: Secondary | ICD-10-CM | POA: Diagnosis not present

## 2022-12-09 DIAGNOSIS — D333 Benign neoplasm of cranial nerves: Secondary | ICD-10-CM | POA: Diagnosis not present

## 2022-12-09 DIAGNOSIS — H6123 Impacted cerumen, bilateral: Secondary | ICD-10-CM | POA: Diagnosis not present

## 2022-12-18 ENCOUNTER — Ambulatory Visit: Payer: Medicare PPO | Admitting: Student

## 2022-12-18 ENCOUNTER — Other Ambulatory Visit: Payer: Self-pay | Admitting: Otolaryngology

## 2022-12-18 DIAGNOSIS — H903 Sensorineural hearing loss, bilateral: Secondary | ICD-10-CM

## 2022-12-18 DIAGNOSIS — D333 Benign neoplasm of cranial nerves: Secondary | ICD-10-CM

## 2022-12-20 NOTE — Progress Notes (Signed)
Cardiology Office Note Date:  12/23/2022  Patient ID:  Cathy Montoya 09-Aug-1946, MRN [id number] PCP:  Laurann Montana, MD  Electrophysiologist: Dr. Elberta Fortis    Chief Complaint:  1 year, pre-op   History of Present Illness: Cathy Montoya is a 77 y.o. female with history of breast ca (s/p lumpectomy, chemo/XRT), AFib/AFlutter  She saw Dr. Johney Frame 05/24/20, off OAC post ablation  She saw Dr. Elberta Fortis 12/23/21, SOB with inclines/stairs especially, walking her dog, she suspected deconditioning.  No symptoms of her AF, no changes were made.  Offered to update her echo, she wanted to hold off No evidence of volume OL noted.  TODAY She is pending L hip replacement needs cardiac evaluation She doesn't sleep well, of late can not get comfortable for long with her hip pain Has been avoiding NSAIDS.  She does not think she has had any Afib No CP, palpitations, cardiac awareness No SOB or DOE No near syncope or syncope.  She despite her hip remains very active, push mows the lawn, walks the dog, household care with good exertional capacity, no CP, SOB   AF/AAD hx CTI 02/22/2018 (Dr. Johney Frame) PVI 04/18/2019 (Dr. Johney Frame)   Past Medical History:  Diagnosis Date   Abnormal Pap smear    Acne rosacea    Alopecia    frontal   Arthritis    knees, lt  hip, lower back, hands, shoulders   Breast cancer, right ([facility]) 04/11/2004   invasive lobular carcinoma treated with 1 chemo treatment and radiation   Cat allergies    History of kidney stones    Intermittent vertigo    FRONTAL FIBROSIS ALOPECIA   Lipoma    Paroxysmal atrial fibrillation ([facility])    Shoulder pain, left    Typical atrial flutter ([facility])     Past Surgical History:  Procedure Laterality Date   A-FLUTTER ABLATION N/A 04/21/2018   Procedure: A-FLUTTER ABLATION;  Surgeon: Hillis Range, MD;  [facility]: [facility];  Service: Cardiovascular;  Laterality: N/A;   ATRIAL FIBRILLATION ABLATION N/A 04/18/2019   Procedure:  ATRIAL FIBRILLATION ABLATION;  Surgeon: Hillis Range, MD;  Location: [facility];  Service: Cardiovascular;  Laterality: N/A;   BREAST LUMPECTOMY  01/10/2004   lymph node, chemo x 1/radiation   COLONOSCOPY     CYSTOSCOPY/URETEROSCOPY/HOLMIUM LASER/STENT PLACEMENT Bilateral 10/21/2018   Procedure: CYSTOSCOPY/BILATERAL RETROGRADE PYELOGRAM / BILATERAL URETEROSCOPY/ BILATERAL STENT PLACEMENT/BILATERAL STONE BASKETING;  Surgeon: Crista Elliot, MD;  Location: [facility];  Service: Urology;  Laterality: Bilateral;   GYNECOLOGIC CRYOSURGERY  1988   cervical dysplasia   WRIST SURGERY Left 08/23/2019   Dr. Blaine Hamper    Current Outpatient Medications  Medication Sig Dispense Refill   Cholecalciferol (D3-1000) 25 MCG (1000 UT) tablet Take 1,000 Units by mouth daily.     diltiazem (CARDIZEM) 30 MG tablet Take 1-2 tablets (30-60 mg total) by mouth every 4 (four) hours as needed (High heart rates). 45 tablet 2   Glucosamine HCl (GLUCOSAMINE PO) Take 1 capsule by mouth daily.     latanoprost (XALATAN) 0.005 % ophthalmic solution Place 1 drop into both eyes at bedtime.     Multiple Vitamins-Minerals (CENTRUM SILVER ULTRA WOMENS PO) Take 1 tablet by mouth daily.     timolol (TIMOPTIC) 0.5 % ophthalmic solution Place 1 drop into both eyes 2 (two) times daily.     Turmeric (QC TUMERIC COMPLEX PO) Take 1 capsule by mouth daily.     No current facility-administered medications  for this visit.    Allergies:   Cat hair extract, Mixed feathers, and Amoxicillin   Social History:  The patient  reports that she has never smoked. She has never used smokeless tobacco. She reports current alcohol use. She reports that she does not use drugs.   Family History:  The patient's family history includes Alzheimer's disease in her mother; Breast cancer in her maternal grandmother, paternal grandmother, and another family member; Breast cancer (age of onset: 63) in her sister; Cancer in her father, maternal  grandmother, and another family member; Hypertension in her father; Other in her maternal grandfather; Thyroid disease in her daughter and mother.  ROS:  Please see the history of present illness.    All other systems are reviewed and otherwise negative.   PHYSICAL EXAM:  VS:  BP (!) 152/74   Pulse 68   Ht 5' 8.8" (1.748 m)   Wt 158 lb (71.7 kg)   LMP 08/17/1998   SpO2 98%   BMI 23.47 kg/m  BMI: Body mass index is 23.47 kg/m. Well nourished, well developed, in no acute distress HEENT: normocephalic, atraumatic Neck: no JVD, carotid bruits or masses Cardiac:   RRR; no significant murmurs, no rubs, or gallops Lungs:  CTA b/l, no wheezing, rhonchi or rales Abd: soft, nontender MS: no deformity or atrophy Ext: no edema Skin: warm and dry, no rash Neuro:  No gross deficits appreciated Psych: euthymic mood, full affect   EKG:  Done today and reviewed by myself shows  SR 68bpm, T changes appear unchanged from prior  04/18/2019: EPS/ablation CONCLUSIONS: 1. Sinus rhythm upon presentation.   2. Intracardiac echo reveals a moderate sized left atrium with four separate pulmonary veins without evidence of pulmonary vein stenosis. 3. Successful electrical isolation and anatomical encircling of all four pulmonary veins with radiofrequency current.    4. Cavo-tricuspid isthmus ablation was performed with complete bidirectional isthmus block achieved.  5. No inducible arrhythmias following ablation both on and off of Isuprel 6. No early apparent complications.   04/21/2018: EPS/ablation CONCLUSIONS:  1. Sinus rhythm upon presentation.  2. Atrial flutter, not induced today  3. Empiric cavotricuspid isthmus ablation was performed  4. Atrial fibrillation observed with catheter manipulation which was persistent despite cardioversion shocks and ibutilide infusion. 5. No early apparent complications.    TTE 2019   - Left ventricle: The cavity size was normal. Systolic function was     normal. The estimated ejection fraction was in the range of 55%    to 60%. Wall motion was normal; there were no regional wall    motion abnormalities. Features are consistent with a pseudonormal    left ventricular filling pattern, with concomitant abnormal    relaxation and increased filling pressure (grade 2 diastolic    dysfunction).  - Aortic valve: There was mild regurgitation.  - Right atrium: The atrium was mildly dilated.  - Pulmonary arteries: Systolic pressure was mildly increased. PA    peak pressure: 44 mm Hg (S).  Recent Labs: No results found for requested labs within last 365 days.  No results found for requested labs within last 365 days.   CrCl cannot be calculated (Patient's most recent lab result is older than the maximum 21 days allowed.).   Wt Readings from Last 3 Encounters:  12/23/22 158 lb (71.7 kg)  12/23/21 158 lb (71.7 kg)  05/24/20 155 lb 9.6 oz (70.6 kg)     Other studies reviewed: Additional studies/records reviewed today include: summarized above  ASSESSMENT AND PLAN:  Paroxysmal AFib CHA2DS2Vasc is 3 for age/gender Off OAC post ablation back in 2021 No symptoms of Afib  2. High BP Recheck is 148/70 Up/down at various visit On/off checks at home, some like today, some lower  I have advised she take her BP every day and bring them to her PMD visit next week Suspect she may need BP management Deferred to her primary doctor  3. Pre-op, pending hip surgery Low cardiac risk score (0.4%) Low cardiac risk surgery No symptoms to suggest clinical cardiac changes No cardiac contraindication to hip surgery    Disposition: F/u with [place] again in a year, sooner if needed  Current medicines are reviewed at length with the patient today.  The patient did not have any concerns regarding medicines.  Norma Fredrickson, PA-C 12/23/2022 2:24 PM     [facility] [address] [place] [address] [phone] (office)   [phone] (fax)

## 2022-12-23 ENCOUNTER — Encounter: Payer: Self-pay | Admitting: Physician Assistant

## 2022-12-23 ENCOUNTER — Ambulatory Visit: Payer: Medicare PPO | Attending: Student | Admitting: Physician Assistant

## 2022-12-23 VITALS — BP 152/74 | HR 68 | Ht 68.8 in | Wt 158.0 lb

## 2022-12-23 DIAGNOSIS — I480 Paroxysmal atrial fibrillation: Secondary | ICD-10-CM | POA: Diagnosis not present

## 2022-12-23 DIAGNOSIS — Z01818 Encounter for other preprocedural examination: Secondary | ICD-10-CM | POA: Diagnosis not present

## 2022-12-23 NOTE — Patient Instructions (Signed)
Medication Instructions:    Your physician recommends that you continue on your current medications as directed. Please refer to the Current Medication list given to you today.   *If you need a refill on your cardiac medications before your next appointment, please call your pharmacy*   Lab Work:  NONE ORDERED  TODAY    If you have labs (blood work) drawn today and your tests are completely normal, you will receive your results only by: MyChart Message (if you have MyChart) OR A paper copy in the mail If you have any lab test that is abnormal or we need to change your treatment, we will call you to review the results.   Testing/Procedures: NONE ORDERED  TODAY    Follow-Up: At [facility], you and your health needs are our priority.  As part of our continuing mission to provide you with exceptional heart care, we have created designated Provider Care Teams.  These Care Teams include your primary Cardiologist (physician) and Advanced Practice Providers (APPs -  Physician Assistants and Nurse Practitioners) who all work together to provide you with the care you need, when you need it.  We recommend signing up for the patient portal called "MyChart".  Sign up information is provided on this After Visit Summary.  MyChart is used to connect with patients for Virtual Visits (Telemedicine).  Patients are able to view lab/test results, encounter notes, upcoming appointments, etc.  Non-urgent messages can be sent to your provider as well.   To learn more about what you can do with MyChart, go to [email or web address].    Your next appointment:   1 year(s)  Provider:   You may see Will Martin Camnitz, MD or one of the following Advanced Practice Providers on your designated Care Team:   Renee Ursuy, PA-C Michael "Andy" Tillery, PA-C Suzann Riddle, NP     Other Instructions  

## 2022-12-28 DIAGNOSIS — E785 Hyperlipidemia, unspecified: Secondary | ICD-10-CM | POA: Diagnosis not present

## 2022-12-28 DIAGNOSIS — M1612 Unilateral primary osteoarthritis, left hip: Secondary | ICD-10-CM | POA: Diagnosis not present

## 2022-12-28 DIAGNOSIS — Z0000 Encounter for general adult medical examination without abnormal findings: Secondary | ICD-10-CM | POA: Diagnosis not present

## 2022-12-28 DIAGNOSIS — M79674 Pain in right toe(s): Secondary | ICD-10-CM | POA: Diagnosis not present

## 2022-12-28 DIAGNOSIS — M8588 Other specified disorders of bone density and structure, other site: Secondary | ICD-10-CM | POA: Diagnosis not present

## 2022-12-28 DIAGNOSIS — M21611 Bunion of right foot: Secondary | ICD-10-CM | POA: Diagnosis not present

## 2022-12-28 DIAGNOSIS — Z8679 Personal history of other diseases of the circulatory system: Secondary | ICD-10-CM | POA: Diagnosis not present

## 2022-12-28 DIAGNOSIS — M79675 Pain in left toe(s): Secondary | ICD-10-CM | POA: Diagnosis not present

## 2022-12-28 DIAGNOSIS — M21612 Bunion of left foot: Secondary | ICD-10-CM | POA: Diagnosis not present

## 2023-01-13 DIAGNOSIS — M8588 Other specified disorders of bone density and structure, other site: Secondary | ICD-10-CM | POA: Diagnosis not present

## 2023-01-13 DIAGNOSIS — E785 Hyperlipidemia, unspecified: Secondary | ICD-10-CM | POA: Diagnosis not present

## 2023-01-25 ENCOUNTER — Encounter: Payer: Self-pay | Admitting: Otolaryngology

## 2023-02-03 ENCOUNTER — Ambulatory Visit
Admission: RE | Admit: 2023-02-03 | Discharge: 2023-02-03 | Disposition: A | Discharge status: Home / Self Care | Payer: Medicare PPO | Source: Ambulatory Visit | Attending: Otolaryngology | Admitting: Otolaryngology

## 2023-02-03 DIAGNOSIS — D333 Benign neoplasm of cranial nerves: Secondary | ICD-10-CM

## 2023-02-03 DIAGNOSIS — H401132 Primary open-angle glaucoma, bilateral, moderate stage: Secondary | ICD-10-CM | POA: Diagnosis not present

## 2023-02-03 DIAGNOSIS — H903 Sensorineural hearing loss, bilateral: Secondary | ICD-10-CM

## 2023-02-03 DIAGNOSIS — H52203 Unspecified astigmatism, bilateral: Secondary | ICD-10-CM | POA: Diagnosis not present

## 2023-02-03 DIAGNOSIS — Z961 Presence of intraocular lens: Secondary | ICD-10-CM | POA: Diagnosis not present

## 2023-02-03 MED ORDER — GADOPICLENOL 0.5 MMOL/ML IV SOLN
7.0000 mL | Freq: Once | INTRAVENOUS | Status: AC | PRN
  Administered 2023-02-03: 7 mL via INTRAVENOUS

## 2023-04-27 DIAGNOSIS — Z79899 Other long term (current) drug therapy: Secondary | ICD-10-CM | POA: Diagnosis not present

## 2023-05-02 IMAGING — MG MM DIGITAL SCREENING BILAT W/ TOMO AND CAD
8 series · 9 of 24 positions shown · non-contrast
Comparison: Previous exam(s).

CLINICAL DATA: Screening.

EXAM:
DIGITAL SCREENING BILATERAL MAMMOGRAM WITH TOMOSYNTHESIS AND CAD
TECHNIQUE: Bilateral screening digital craniocaudal and mediolateral oblique
mammograms were obtained. Bilateral screening digital breast
tomosynthesis was performed. The images were evaluated with
computer-aided detection.

[L MLO synth-2D]
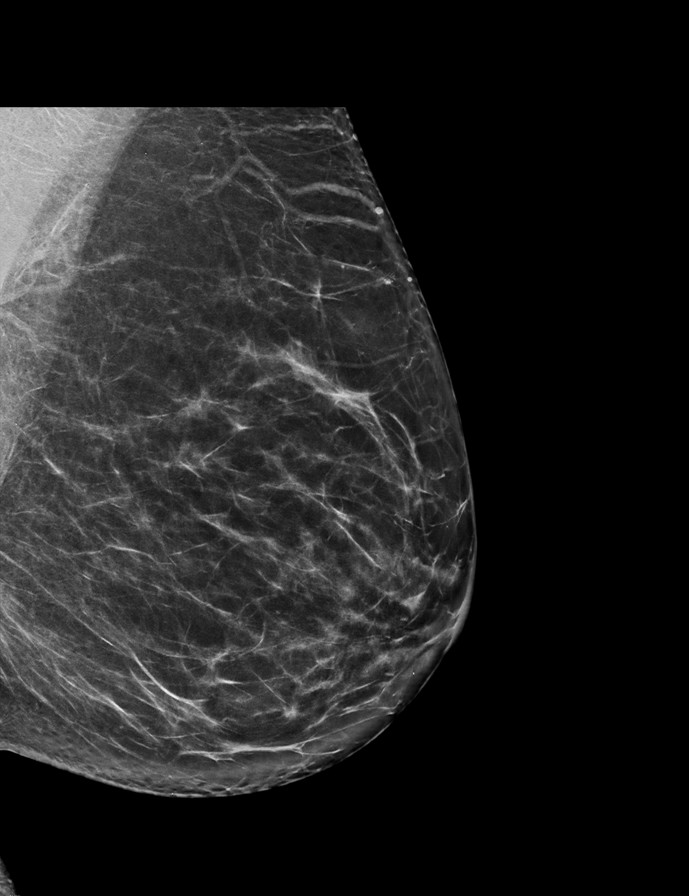

[R MLO synth-2D]
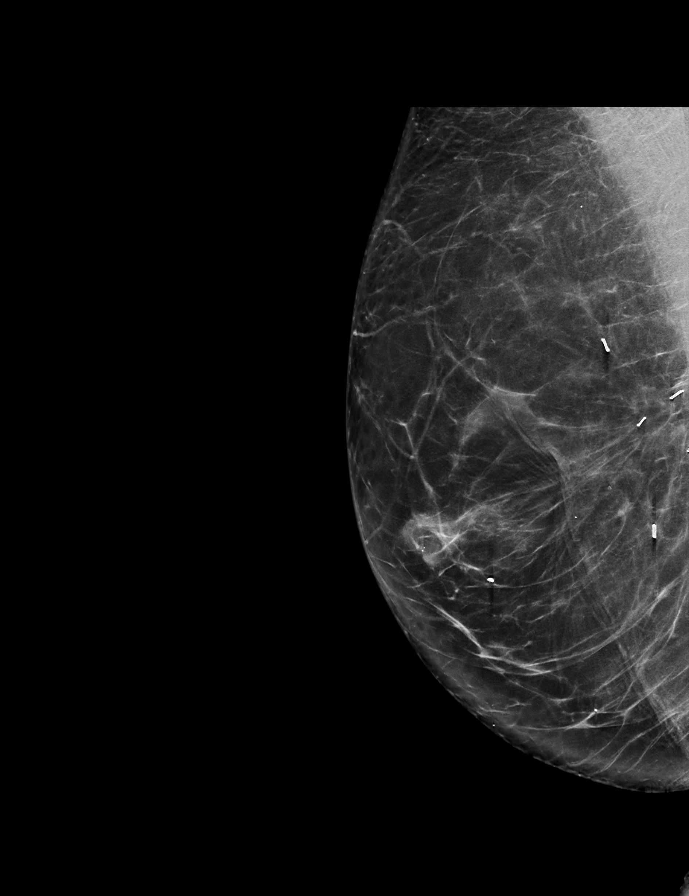

[R CC synth-2D]
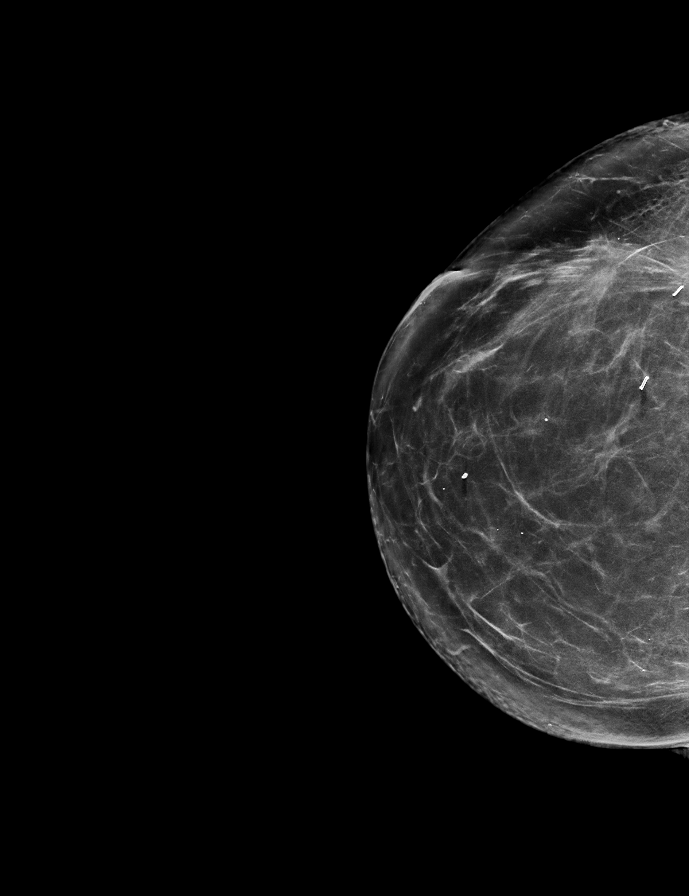

[L CC synth-2D]
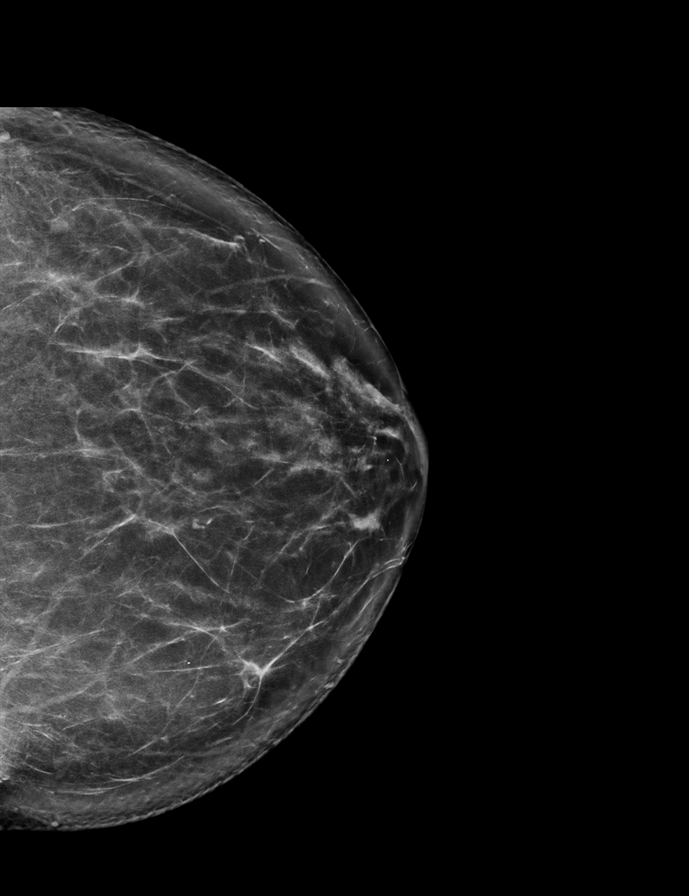

[L MLO tomo · 2 of 78 frames shown]
[frame 26/78]
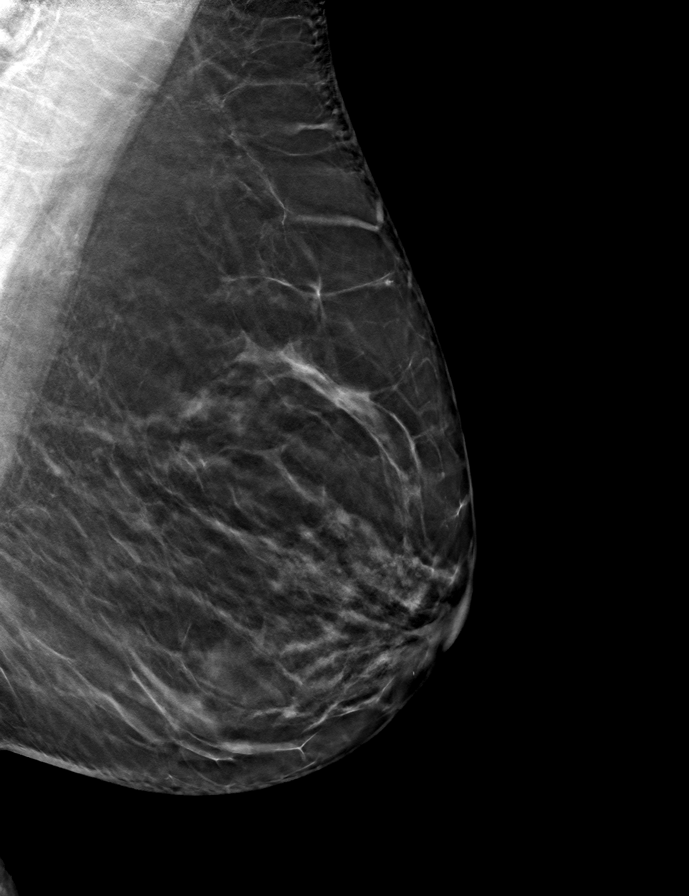
[frame 39/78]
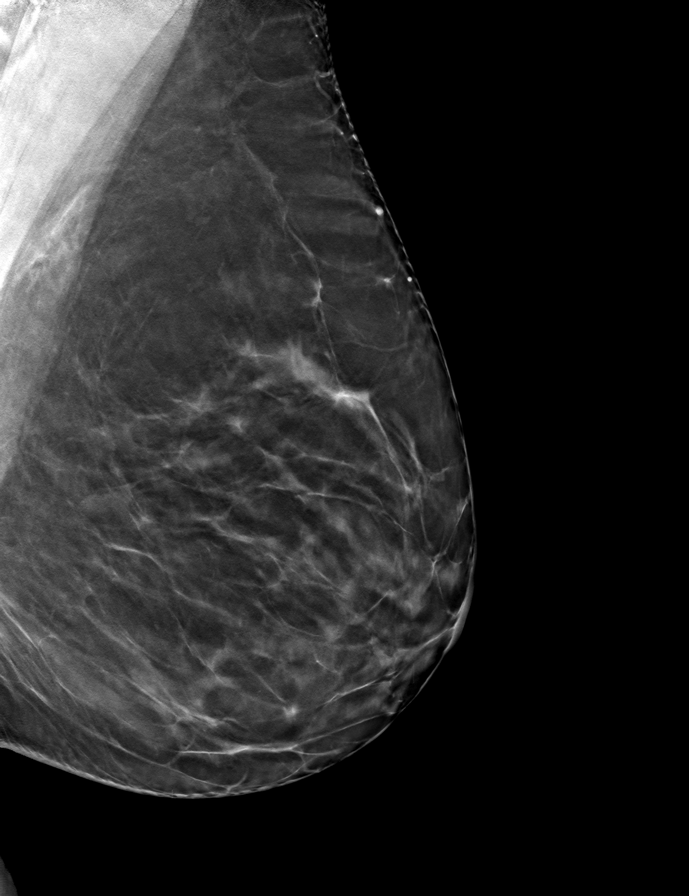

[R MLO tomo · tomo slice 39/76.0]
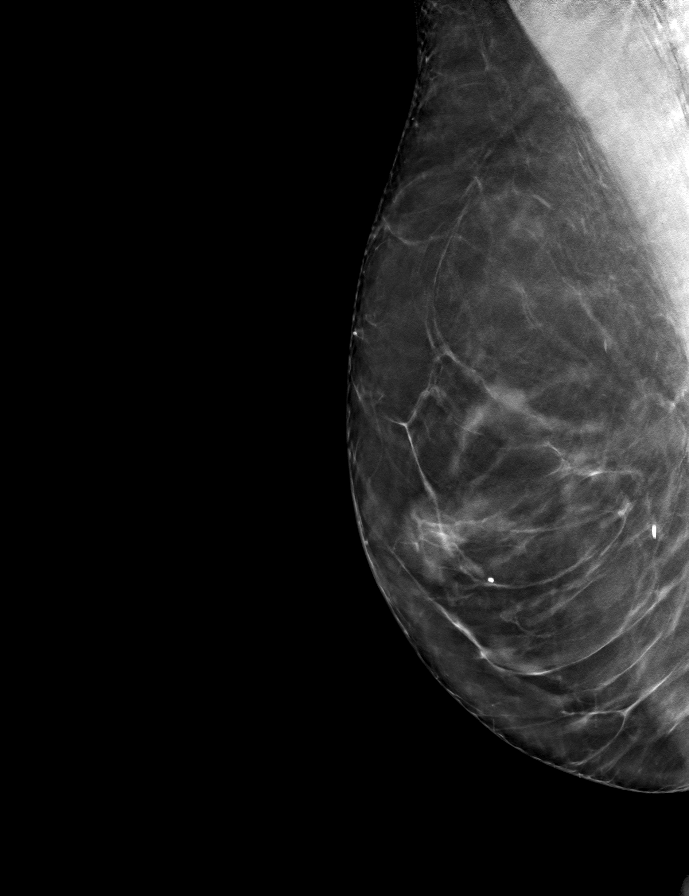

[R CC tomo · tomo slice 40/79.0]
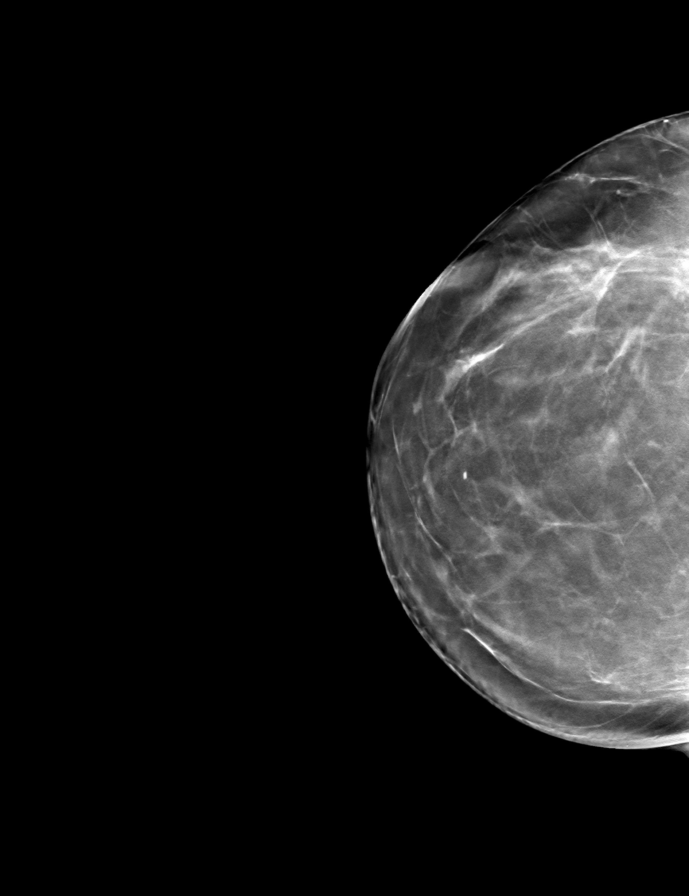

[L CC tomo · tomo slice 39/78.0]
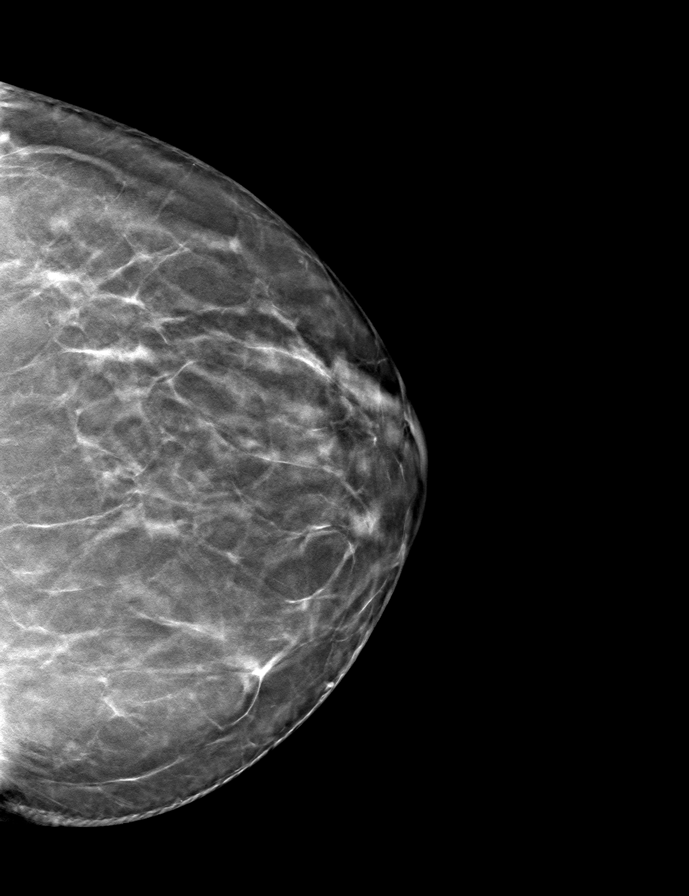

[9 of 24 positions shown; findings below may reference images not displayed]

ACR Breast Density Category b: There are scattered areas of
fibroglandular density.
FINDINGS: There are no findings suspicious for malignancy.
IMPRESSION: No mammographic evidence of malignancy. A result letter of this
screening mammogram will be mailed directly to the patient.

RECOMMENDATION:
Screening mammogram in one year. (Code:51-O-LD2)

BI-RADS CATEGORY  1: Negative.

## 2023-05-03 DIAGNOSIS — M1612 Unilateral primary osteoarthritis, left hip: Secondary | ICD-10-CM | POA: Diagnosis not present

## 2023-06-11 ENCOUNTER — Ambulatory Visit (INDEPENDENT_AMBULATORY_CARE_PROVIDER_SITE_OTHER): Payer: Medicare PPO | Admitting: Otolaryngology

## 2023-06-11 ENCOUNTER — Encounter (INDEPENDENT_AMBULATORY_CARE_PROVIDER_SITE_OTHER): Payer: Self-pay

## 2023-06-11 VITALS — Ht 68.0 in | Wt 159.0 lb

## 2023-06-11 DIAGNOSIS — R42 Dizziness and giddiness: Secondary | ICD-10-CM

## 2023-06-11 DIAGNOSIS — H9041 Sensorineural hearing loss, unilateral, right ear, with unrestricted hearing on the contralateral side: Secondary | ICD-10-CM

## 2023-06-11 DIAGNOSIS — D333 Benign neoplasm of cranial nerves: Secondary | ICD-10-CM | POA: Diagnosis not present

## 2023-06-14 DIAGNOSIS — R42 Dizziness and giddiness: Secondary | ICD-10-CM | POA: Insufficient documentation

## 2023-06-14 DIAGNOSIS — H9041 Sensorineural hearing loss, unilateral, right ear, with unrestricted hearing on the contralateral side: Secondary | ICD-10-CM | POA: Insufficient documentation

## 2023-06-14 DIAGNOSIS — D333 Benign neoplasm of cranial nerves: Secondary | ICD-10-CM | POA: Insufficient documentation

## 2023-06-14 NOTE — Progress Notes (Signed)
Patient ID: Cathy Montoya, female   DOB: 1946-07-15, 77 y.o.   MRN: [id number]  Follow-up: Right ear hearing loss, right ear vestibular schwannoma, dizziness  HPI: The patient is a 77 year old female who returns today for her follow-up evaluation.  The patient was previously seen for her recurrent dizziness, asymmetric right ear hearing loss, and right ear acoustic neuroma.  She was noted to have a 1.1 cm enhancing mass within the right internal auditory canal, extending into the right cerebellopontine angle. The patient returns today complaining of persistent right ear hearing difficulty.  She continues to have occasional dizziness.  She denies any spinning vertigo.  Her recent MRI scan showed no significant change in her acoustic neuroma.  The patient underwent a hip replacement surgery 1 month ago.  She is walking with a cane.  Exam: General: Communicates without difficulty, well nourished, no acute distress. Head: Normocephalic, no evidence injury, no tenderness, facial buttresses intact without stepoff. Face/sinus: No tenderness to palpation and percussion. Facial movement is normal and symmetric. Eyes: PERRL, EOMI. No scleral icterus, conjunctivae clear. Neuro: CN II exam reveals vision grossly intact.  No nystagmus at any point of gaze. EAC: The ear canals and TMs are noted to be normal.  Nose: External evaluation reveals normal support and skin without lesions.  Dorsum is intact.  Anterior rhinoscopy reveals pink mucosa over anterior aspect of inferior turbinates and intact septum.  No purulence noted. Oral:  Oral cavity and oropharynx are intact, symmetric, without erythema or edema.  Mucosa is moist without lesions. Neck: Full range of motion without pain.  There is no significant lymphadenopathy.  No masses palpable.  Thyroid bed within normal limits to palpation.  Parotid glands and submandibular glands equal bilaterally without mass.  Trachea is midline. Neuro:  CN 2-12 grossly intact. Gait  wide-based. Vestibular: No nystagmus at any point of gaze. Vestibular: There is no nystagmus with pneumatic pressure on either tympanic membrane or Valsalva. The cerebellar examination is unremarkable.   Assessment: 1.  The patient has a stable right internal auditory canal vestibular schwannoma. Her dizziness and balance difficulty are likely a result of her vestibular schwannoma. 2.  Asymmetric right ear high-frequency sensorineural hearing loss.  Plan: 1.  The physical exam findings and MRI results are reviewed with the patient. 2.  The pathophysiology of dizziness and vestibular dysfunction are discussed extensively with the patient.  The patient is encouraged to perform balance exercises at home. 3.  The patient is a candidate for hearing amplification.  4.  The patient will return for reevaluation in 6 months, sooner if needed.

## 2023-06-23 DIAGNOSIS — Z96642 Presence of left artificial hip joint: Secondary | ICD-10-CM | POA: Diagnosis not present

## 2023-06-23 DIAGNOSIS — Z471 Aftercare following joint replacement surgery: Secondary | ICD-10-CM | POA: Diagnosis not present

## 2023-07-07 ENCOUNTER — Other Ambulatory Visit: Payer: Self-pay | Admitting: Family Medicine

## 2023-07-07 DIAGNOSIS — Z1231 Encounter for screening mammogram for malignant neoplasm of breast: Secondary | ICD-10-CM

## 2023-07-20 DIAGNOSIS — H52203 Unspecified astigmatism, bilateral: Secondary | ICD-10-CM | POA: Diagnosis not present

## 2023-07-20 DIAGNOSIS — H401122 Primary open-angle glaucoma, left eye, moderate stage: Secondary | ICD-10-CM | POA: Diagnosis not present

## 2023-07-20 DIAGNOSIS — Z961 Presence of intraocular lens: Secondary | ICD-10-CM | POA: Diagnosis not present

## 2023-07-20 DIAGNOSIS — H401111 Primary open-angle glaucoma, right eye, mild stage: Secondary | ICD-10-CM | POA: Diagnosis not present

## 2023-08-23 ENCOUNTER — Ambulatory Visit: Payer: Medicare PPO

## 2023-08-23 ENCOUNTER — Ambulatory Visit
Admission: RE | Admit: 2023-08-23 | Discharge: 2023-08-23 | Disposition: A | Discharge status: Home / Self Care | Payer: Medicare PPO | Source: Ambulatory Visit | Attending: Family Medicine | Admitting: Family Medicine

## 2023-08-23 DIAGNOSIS — Z1231 Encounter for screening mammogram for malignant neoplasm of breast: Secondary | ICD-10-CM | POA: Diagnosis not present

## 2023-09-02 DIAGNOSIS — D225 Melanocytic nevi of trunk: Secondary | ICD-10-CM | POA: Diagnosis not present

## 2023-09-02 DIAGNOSIS — L820 Inflamed seborrheic keratosis: Secondary | ICD-10-CM | POA: Diagnosis not present

## 2023-09-02 DIAGNOSIS — L723 Sebaceous cyst: Secondary | ICD-10-CM | POA: Diagnosis not present

## 2023-09-02 DIAGNOSIS — L239 Allergic contact dermatitis, unspecified cause: Secondary | ICD-10-CM | POA: Diagnosis not present

## 2023-11-01 DIAGNOSIS — K644 Residual hemorrhoidal skin tags: Secondary | ICD-10-CM | POA: Diagnosis not present

## 2023-11-01 DIAGNOSIS — L989 Disorder of the skin and subcutaneous tissue, unspecified: Secondary | ICD-10-CM | POA: Diagnosis not present

## 2023-11-01 DIAGNOSIS — R030 Elevated blood-pressure reading, without diagnosis of hypertension: Secondary | ICD-10-CM | POA: Diagnosis not present

## 2023-12-10 ENCOUNTER — Ambulatory Visit (INDEPENDENT_AMBULATORY_CARE_PROVIDER_SITE_OTHER): Payer: Medicare PPO

## 2023-12-22 ENCOUNTER — Telehealth (INDEPENDENT_AMBULATORY_CARE_PROVIDER_SITE_OTHER): Payer: Self-pay | Admitting: Otolaryngology

## 2023-12-22 NOTE — Telephone Encounter (Signed)
 Confirmed appt location for 12/23/2023.

## 2023-12-23 ENCOUNTER — Encounter (INDEPENDENT_AMBULATORY_CARE_PROVIDER_SITE_OTHER): Payer: Self-pay | Admitting: Otolaryngology

## 2023-12-23 ENCOUNTER — Ambulatory Visit (INDEPENDENT_AMBULATORY_CARE_PROVIDER_SITE_OTHER): Payer: Medicare PPO | Admitting: Otolaryngology

## 2023-12-23 DIAGNOSIS — D333 Benign neoplasm of cranial nerves: Secondary | ICD-10-CM | POA: Diagnosis not present

## 2023-12-23 DIAGNOSIS — H6122 Impacted cerumen, left ear: Secondary | ICD-10-CM | POA: Diagnosis not present

## 2023-12-23 DIAGNOSIS — R42 Dizziness and giddiness: Secondary | ICD-10-CM

## 2023-12-23 DIAGNOSIS — H9041 Sensorineural hearing loss, unilateral, right ear, with unrestricted hearing on the contralateral side: Secondary | ICD-10-CM

## 2023-12-25 DIAGNOSIS — H6122 Impacted cerumen, left ear: Secondary | ICD-10-CM | POA: Insufficient documentation

## 2023-12-25 NOTE — Progress Notes (Signed)
 Patient ID: Cathy Montoya, female   DOB: 04-May-1946, 78 y.o.   MRN: [id number]  Follow-up: Right ear vestibular schwannoma, recurrent dizziness, right ear hearing loss  HPI: The patient is a 78 year old female who returns today for her follow-up evaluation.  The patient has a history of recurrent dizziness, asymmetric right ear hearing loss, and right ear vestibular schwannoma.  The her MRI scan showed a 1.1 cm enhancing mass within the right internal auditory canal, extending into the right cerebellopontine angle.  The treatment options for her vestibular schwannoma were discussed.  The patient elected to proceed with conservative observation.  The patient returns today reporting no significant dizziness over the past 6 months.  She is still having difficulty hearing on the right side.  Currently she denies any otalgia, otorrhea, or tinnitus.  Exam: General: Communicates without difficulty, well nourished, no acute distress. Head: Normocephalic, no evidence injury, no tenderness, facial buttresses intact without stepoff. Face/sinus: No tenderness to palpation and percussion. Facial movement is normal and symmetric. Eyes: PERRL, EOMI. No scleral icterus, conjunctivae clear. Neuro: CN II exam reveals vision grossly intact.  No nystagmus at any point of gaze.  Ears: Left ear cerumen impaction.  Nose: External evaluation reveals normal support and skin without lesions.  Dorsum is intact.  Anterior rhinoscopy reveals pink mucosa over anterior aspect of inferior turbinates and intact septum.  No purulence noted. Oral:  Oral cavity and oropharynx are intact, symmetric, without erythema or edema.  Mucosa is moist without lesions. Neck: Full range of motion without pain.  There is no significant lymphadenopathy.  No masses palpable.  Thyroid  bed within normal limits to palpation.  Parotid glands and submandibular glands equal bilaterally without mass.  Trachea is midline. Neuro:  CN 2-12 grossly intact. Gait  wide-based. Vestibular: No nystagmus at any point of gaze. Vestibular: There is no nystagmus with pneumatic pressure on either tympanic membrane or Valsalva. The cerebellar examination is unremarkable.   Procedure: Left ear cerumen disimpaction Anesthesia: None Description: Under the operating microscope, the cerumen is carefully removed with a combination of cerumen currette, alligator forceps, and suction catheters.  After the cerumen is removed, the TMs are noted to be normal.  No mass, erythema, or lesions. The patient tolerated the procedure well.     Assessment: 1.  Incidental finding of left ear cerumen impaction. 2.  The physical exam findings are reviewed with the patient. 3.  Subjectively stable asymmetric right ear sensorineural hearing loss. 4.  The patient has a 1.1 cm right ear vestibular schwannoma.  The dizziness is currently under control.  Plan: 1.  Oral nephroscopy with left ear cerumen disimpaction.   2.  The physical exam findings are reviewed with the patient. 3.  The treatment options for vestibular schwannoma are again discussed.  The patient elected to proceed with conservative observation. 4.  The patient is a candidate for hearing amplification.  Hearing aid options are discussed. 5.  Repeat MRI scan in 1 year.  The patient will return for reevaluation after her MRI scan.

## 2024-01-13 ENCOUNTER — Other Ambulatory Visit (HOSPITAL_BASED_OUTPATIENT_CLINIC_OR_DEPARTMENT_OTHER): Payer: Self-pay | Admitting: Family Medicine

## 2024-01-13 DIAGNOSIS — M8580 Other specified disorders of bone density and structure, unspecified site: Secondary | ICD-10-CM

## 2024-01-13 DIAGNOSIS — Z1331 Encounter for screening for depression: Secondary | ICD-10-CM | POA: Diagnosis not present

## 2024-01-13 DIAGNOSIS — Z8679 Personal history of other diseases of the circulatory system: Secondary | ICD-10-CM | POA: Diagnosis not present

## 2024-01-13 DIAGNOSIS — E785 Hyperlipidemia, unspecified: Secondary | ICD-10-CM | POA: Diagnosis not present

## 2024-01-13 DIAGNOSIS — I10 Essential (primary) hypertension: Secondary | ICD-10-CM | POA: Diagnosis not present

## 2024-01-13 DIAGNOSIS — Z853 Personal history of malignant neoplasm of breast: Secondary | ICD-10-CM | POA: Diagnosis not present

## 2024-01-13 DIAGNOSIS — K644 Residual hemorrhoidal skin tags: Secondary | ICD-10-CM | POA: Diagnosis not present

## 2024-01-13 DIAGNOSIS — Z0000 Encounter for general adult medical examination without abnormal findings: Secondary | ICD-10-CM | POA: Diagnosis not present

## 2024-01-13 DIAGNOSIS — M8588 Other specified disorders of bone density and structure, other site: Secondary | ICD-10-CM | POA: Diagnosis not present

## 2024-01-19 DIAGNOSIS — H52203 Unspecified astigmatism, bilateral: Secondary | ICD-10-CM | POA: Diagnosis not present

## 2024-01-19 DIAGNOSIS — H401131 Primary open-angle glaucoma, bilateral, mild stage: Secondary | ICD-10-CM | POA: Diagnosis not present

## 2024-05-02 ENCOUNTER — Ambulatory Visit: Attending: Student | Admitting: Student

## 2024-05-02 ENCOUNTER — Telehealth: Payer: Self-pay | Admitting: Student

## 2024-05-02 ENCOUNTER — Encounter: Payer: Self-pay | Admitting: Student

## 2024-05-02 VITALS — BP 158/76 | HR 58 | Ht 68.0 in | Wt 162.0 lb

## 2024-05-02 DIAGNOSIS — I10 Essential (primary) hypertension: Secondary | ICD-10-CM

## 2024-05-02 DIAGNOSIS — I480 Paroxysmal atrial fibrillation: Secondary | ICD-10-CM

## 2024-05-02 NOTE — Telephone Encounter (Signed)
 Pt spoke with Prentice Passey, PA at appt today about starting Eliquis  and pt has decided she would like to start taking it and would like provider to send in a prescription to  [id number] - [place], [place] - [address] AT [address] OF [facility] [address]  For 90 days so she can begin taking it.  Pt also states that Prentice Passey wanted pt to be seen 1 month after pt starts taking Eliquis  to see how she does on it. Please advise.

## 2024-05-02 NOTE — Progress Notes (Signed)
  Electrophysiology Office Note:   Date:  05/02/2024  ID:  Cathy Montoya, DOB [birth year], MRN [id number]  Primary Cardiologist: Lonni Cash, MD Electrophysiologist: Will Gladis Norton, MD   Electrophysiologist:  Soyla Gladis Norton, MD      History of Present Illness:   Cathy Montoya is a 78 y.o. female with h/o breast CA s/p lumpectomy + Chemo/XRT, AF/AFL s/p ablation 05/2020, and HTN seen today for routine electrophysiology followup.   Since last being seen in our clinic the patient reports doing very well. She has mild SOB when walking her dog up hills, but otherwise does her ADLs without issue. Otherwise, not very active and admits this is likely some of the cause. Currently, she denies chest pain, palpitations, PND, orthopnea, nausea, vomiting, dizziness, syncope, edema, weight gain, or early satiety.   Review of systems complete and found to be negative unless listed in HPI.   EP Information / Studies Reviewed:    EKG is ordered today. Personal review as below.  EKG Interpretation Date/Time:  Tuesday May 02 2024 10:14:57 EDT Ventricular Rate:  58 PR Interval:  138 QRS Duration:  94 QT Interval:  416 QTC Calculation: 408 R Axis:   40  Text Interpretation: Sinus bradycardia Incomplete right bundle branch block Nonspecific T wave abnormality  Confirmed by Lesia Sharper [phone]) on 05/02/2024 10:22:25 AM    Arrhythmia/Device History S/p CTI 04/2018 S/p PVI 04/2019   Physical Exam:   VS:  BP (!) 158/76   Pulse (!) 58   Ht 5' 8 (1.727 m)   Wt 162 lb (73.5 kg)   LMP 08/17/1998   SpO2 97%   BMI 24.63 kg/m    Wt Readings from Last 3 Encounters:  05/02/24 162 lb (73.5 kg)  06/11/23 159 lb (72.1 kg)  12/23/22 158 lb (71.7 kg)     GEN: No acute distress NECK: No JVD; No carotid bruits CARDIAC: Regular rate and rhythm, no murmurs, rubs, gallops RESPIRATORY:  Clear to auscultation without rales, wheezing or rhonchi  ABDOMEN: Soft, non-tender,  non-distended EXTREMITIES:  No edema; No deformity   ASSESSMENT AND PLAN:    Paroxysmal AF EKG today shows NSR CHA2DS2/VASc is 4.  We discussed the overall risk of CVA is higher given her age.  She is hesitant to start OAC, and wants to discuss with her husband.   Will get BMET and CBC today. She will call back if she decides to start Eliquis . With age of 68 and Weight of 73.5 kg, she would be on 5 mg BID regardless of Creatinine.   Stable on current regimen   Follow up with EP Team in 12 months  OR 1 month post starting OAC if she decides too.   Signed, Sharper Prentice Lesia, PA-C

## 2024-05-02 NOTE — Patient Instructions (Signed)
 Medication Instructions:  Your physician recommends that you continue on your current medications as directed. Please refer to the Current Medication list given to you today.  *If you need a refill on your cardiac medications before your next appointment, please call your pharmacy*  Lab Work: BMET, CBC-TODAY If you have labs (blood work) drawn today and your tests are completely normal, you will receive your results only by: MyChart Message (if you have MyChart) OR A paper copy in the mail If you have any lab test that is abnormal or we need to change your treatment, we will call you to review the results.  Follow-Up: At [facility], you and your health needs are our priority.  As part of our continuing mission to provide you with exceptional heart care, our providers are all part of one team.  This team includes your primary Cardiologist (physician) and Advanced Practice Providers or APPs (Physician Assistants and Nurse Practitioners) who all work together to provide you with the care you need, when you need it.  Your next appointment:   1 year(s)  Provider:   You may see Will Gladis Norton, MD or one of the following Advanced Practice Providers on your designated Care Team:   Charlies Arthur, [place] Ozell Jodie Passey, PA-C Suzann Riddle, NP Daphne Barrack, NP Artist Pouch, PA-C

## 2024-05-03 ENCOUNTER — Other Ambulatory Visit (HOSPITAL_COMMUNITY): Payer: Self-pay

## 2024-05-03 ENCOUNTER — Ambulatory Visit: Payer: Self-pay | Admitting: Student

## 2024-05-03 LAB — CBC
Hematocrit: 42.8 % (ref 34.0–46.6)
Hemoglobin: 13.9 g/dL (ref 11.1–15.9)
MCH: 30.9 pg (ref 26.6–33.0)
MCHC: 32.5 g/dL (ref 31.5–35.7)
MCV: 95 fL (ref 79–97)
Platelets: 178 x10E3/uL (ref 150–450)
RBC: 4.5 x10E6/uL (ref 3.77–5.28)
RDW: 12.3 % (ref 11.7–15.4)
WBC: 7.9 x10E3/uL (ref 3.4–10.8)

## 2024-05-03 LAB — BASIC METABOLIC PANEL WITH GFR
BUN/Creatinine Ratio: 26 (ref 12–28)
BUN: 23 mg/dL (ref 8–27)
CO2: 25 mmol/L (ref 20–29)
Calcium: 10.7 mg/dL — AB (ref 8.7–10.3)
Chloride: 105 mmol/L (ref 96–106)
Creatinine, Ser: 0.87 mg/dL (ref 0.57–1.00)
Glucose: 93 mg/dL (ref 70–99)
Potassium: 5.3 mmol/L — AB (ref 3.5–5.2)
Sodium: 145 mmol/L — AB (ref 134–144)
eGFR: 69 mL/min/1.73 (ref >59)

## 2024-05-03 MED ORDER — APIXABAN 5 MG PO TABS
5.0000 mg | ORAL_TABLET | Freq: Two times a day (BID) | ORAL | 0 refills | Status: DC
  Filled 2024-05-03: qty 60, 30d supply, fill #0

## 2024-05-03 NOTE — Telephone Encounter (Signed)
 This has been addressed, see lab result.

## 2024-05-04 ENCOUNTER — Other Ambulatory Visit (HOSPITAL_COMMUNITY): Payer: Self-pay

## 2024-05-08 ENCOUNTER — Ambulatory Visit (HOSPITAL_BASED_OUTPATIENT_CLINIC_OR_DEPARTMENT_OTHER)
Admission: RE | Admit: 2024-05-08 | Discharge: 2024-05-08 | Disposition: A | Discharge status: Home / Self Care | Source: Ambulatory Visit | Attending: Family Medicine | Admitting: Family Medicine

## 2024-05-08 DIAGNOSIS — M8580 Other specified disorders of bone density and structure, unspecified site: Secondary | ICD-10-CM

## 2024-06-01 ENCOUNTER — Encounter: Payer: Self-pay | Admitting: Obstetrics and Gynecology

## 2024-06-01 ENCOUNTER — Ambulatory Visit: Admitting: Obstetrics and Gynecology

## 2024-06-01 VITALS — BP 154/79 | HR 55 | Ht 68.0 in | Wt 162.0 lb

## 2024-06-01 DIAGNOSIS — Z01419 Encounter for gynecological examination (general) (routine) without abnormal findings: Secondary | ICD-10-CM | POA: Diagnosis not present

## 2024-06-01 NOTE — Progress Notes (Signed)
 Subjective:     Cathy Montoya is a 78 y.o. female P2 postmenopausal and BMI 24 who is here for a second opinion on a skin tag. Patient reports noticing in in April 2025. She denies any pain or discomfort, more so an awareness when wiping or changing. She does not believe it has increased in size since April. She is without any other concerns. She denies pelvic pain or abnormal discharge. She denies any episodes of postmenopausal vaginal bleeding. She denies any urinary incontinence. Patient was recently restarted on blood thinner by cardiologist.   Past Medical History:  Diagnosis Date   Abnormal Pap smear    Acne rosacea    Alopecia    frontal   Arthritis    knees, lt  hip, lower back, hands, shoulders   Breast cancer, right (HCC) 04/11/2004   invasive lobular carcinoma treated with 1 chemo treatment and radiation   Cat allergies    History of kidney stones    Intermittent vertigo    FRONTAL FIBROSIS ALOPECIA   Lipoma    Paroxysmal atrial fibrillation (HCC)    Shoulder pain, left    Typical atrial flutter (HCC)    Past Surgical History:  Procedure Laterality Date   A-FLUTTER ABLATION N/A 04/21/2018   Procedure: A-FLUTTER ABLATION;  Surgeon: Kelsie Agent, MD;  Location: [facility];  Service: Cardiovascular;  Laterality: N/A;   ATRIAL FIBRILLATION ABLATION N/A 04/18/2019   Procedure: ATRIAL FIBRILLATION ABLATION;  Surgeon: Kelsie Agent, MD;  Location: [facility];  Service: Cardiovascular;  Laterality: N/A;   BREAST LUMPECTOMY  01/10/2004   lymph node, chemo x 1/radiation   COLONOSCOPY     CYSTOSCOPY/URETEROSCOPY/HOLMIUM LASER/STENT PLACEMENT Bilateral 10/21/2018   Procedure: CYSTOSCOPY/BILATERAL RETROGRADE PYELOGRAM / BILATERAL URETEROSCOPY/ BILATERAL STENT PLACEMENT/BILATERAL STONE BASKETING;  Surgeon: Carolee Sherwood JONETTA DOUGLAS, MD;  Location: [facility];  Service: Urology;  Laterality: Bilateral;   GYNECOLOGIC CRYOSURGERY  1988   cervical dysplasia   WRIST SURGERY Left  08/23/2019   Dr. Emi   Family History  Problem Relation Age of Onset   Hypertension Father    Cancer Father        PROSTATE   Thyroid  disease Mother    Alzheimer's disease Mother    Breast cancer Sister 68       mental retardation   Breast cancer Paternal Grandmother    Cancer Maternal Grandmother        unknown type   Breast cancer Maternal Grandmother    Breast cancer Other        aunt   Cancer Other        aunt, unknown type-had hysterectomy   Thyroid  disease Daughter        cyst on thyroid -removed partial thyroid    Other Maternal Grandfather        TRAIN WRECK   Colon cancer Neg Hx     Social History   Socioeconomic History   Marital status: Married    Spouse name: Not on file   Number of children: 2   Years of education: Not on file   Highest education level: Not on file  Occupational History   Occupation: Camera operator  Tobacco Use   Smoking status: Never   Smokeless tobacco: Never  Vaping Use   Vaping status: Never Used  Substance and Sexual Activity   Alcohol use: Yes    Comment: wine-occ   Drug use: No   Sexual activity: Yes    Partners: Male    Birth control/protection:  Post-menopausal  Other Topics Concern   Not on file  Social History Narrative   Not on file   Social Drivers of Health   Financial Resource Strain: Not on file  Food Insecurity: Not on file  Transportation Needs: Not on file  Physical Activity: Not on file  Stress: Not on file  Social Connections: Not on file  Intimate Partner Violence: Not on file   Health Maintenance  Topic Date Due   Hepatitis C Screening  Never done   DTaP/Tdap/Td (1 - Tdap) Never done   Medicare Annual Wellness (AWV)  12/28/2023   Influenza Vaccine  03/17/2024   COVID-19 Vaccine (5 - 2025-26 season) 04/17/2024   Mammogram  08/22/2024   Pneumococcal Vaccine: 50+ Years  Completed   DEXA SCAN  Completed   Zoster Vaccines- Shingrix  Completed   Meningococcal B Vaccine  Aged Out   Hepatitis  B Vaccines 19-59 Average Risk  Discontinued   Colonoscopy  Discontinued       Review of Systems Pertinent items noted in HPI and remainder of comprehensive ROS otherwise negative.   Objective:  Blood pressure (!) 154/79, pulse (!) 55, height 5' 8 (1.727 m), weight 162 lb (73.5 kg), last menstrual period 08/17/1998.    GENERAL: Well-developed, well-nourished female in no acute distress.  HEENT: Normocephalic, atraumatic. Sclerae anicteric.  NECK: Supple. Normal thyroid .  LUNGS: Clear to auscultation bilaterally.  HEART: Regular rate and rhythm. ABDOMEN: Soft, nontender, nondistended. No organomegaly. PELVIC: Normal external female genitalia with labia majora with lost tone and looser skin, easily mis-classified as a skin tag. Vagina is pale and atrophic.  Normal discharge. Normal appearing cervix. Uterus is normal in size. No adnexal mass or tenderness. Chaperone present during the pelvic exam EXTREMITIES: No cyanosis, clubbing, or edema, 2+ distal pulses.   A/P 78 yo postmenopausal here for a second opinion - Reassurance provided - Patient is current on mammography, DEXA scan and colonoscopy - RTC prn - Follow up with PCP and cardiologist regarding elevated BP   Assessment:    Healthy female exam.      Plan:     See After Visit Summary for Counseling Recommendations

## 2024-06-04 NOTE — Progress Notes (Unsigned)
  Electrophysiology Office Note:   Date:  06/05/2024  ID:  Cathy, Montoya 1946/01/02, MRN [id number]  Primary Cardiologist: Lonni Cash, MD Electrophysiologist: Will Gladis Norton, MD   Electrophysiologist:  Soyla Gladis Norton, MD      History of Present Illness:   Cathy Montoya is a 78 y.o. female with h/o breast CA s/p lumpectomy + Chemo/XRT, AF/AFL s/p ablation 05/2020, and HTN seen today for routine electrophysiology followup.   Since last being seen in our clinic the patient reports doing well. Overall, she denies chest pain, palpitations, dyspnea, PND, orthopnea, nausea, vomiting, dizziness, syncope, edema, weight gain, or early satiety.   Review of systems complete and found to be negative unless listed in HPI.   EP Information / Studies Reviewed:    EKG is ordered today. Personal review as below.  EKG Interpretation Date/Time:  Monday June 05 2024 10:25:48 EDT Ventricular Rate:  56 PR Interval:  144 QRS Duration:  94 QT Interval:  416 QTC Calculation: 401 R Axis:   64  Text Interpretation: Sinus bradycardia Incomplete right bundle branch block T wave abnormality, consider inferior ischemia When compared with ECG of 02-May-2024 10:14, No significant change was found Confirmed by Lesia Sharper [phone]) on 06/05/2024 10:33:05 AM    Arrhythmia/Device History S/p CTI 04/2018 S/p PVI 04/2019   Physical Exam:   VS:  BP 122/80 (BP Location: Left Arm, Patient Position: Sitting, Cuff Size: Normal)   Pulse (!) 56   Ht 5' 8 (1.727 m)   Wt 158 lb (71.7 kg)   LMP 08/17/1998   SpO2 99%   BMI 24.02 kg/m    Wt Readings from Last 3 Encounters:  06/05/24 158 lb (71.7 kg)  06/01/24 162 lb (73.5 kg)  05/02/24 162 lb (73.5 kg)     GEN: No acute distress NECK: No JVD; No carotid bruits CARDIAC: Regular rate and rhythm, no murmurs, rubs, gallops RESPIRATORY:  Clear to auscultation without rales, wheezing or rhonchi  ABDOMEN: Soft, non-tender,  non-distended EXTREMITIES:  No edema; No deformity   ASSESSMENT AND PLAN:    Paroxysmal AF EKG today shows sinus bradycardia  CHA2DS2VASc of at least 4 Continue eliquis  5 mg BID Labs today   Secondary hypercoagulable state Pt on Eliquis  as above   HTN Stable on current regimen   Follow up with EP Team in 12 months  Signed, Sharper Prentice Lesia, PA-C

## 2024-06-05 ENCOUNTER — Encounter: Payer: Self-pay | Admitting: Student

## 2024-06-05 ENCOUNTER — Ambulatory Visit: Attending: Student | Admitting: Student

## 2024-06-05 VITALS — BP 122/80 | HR 56 | Ht 68.0 in | Wt 158.0 lb

## 2024-06-05 DIAGNOSIS — I480 Paroxysmal atrial fibrillation: Secondary | ICD-10-CM

## 2024-06-05 DIAGNOSIS — I483 Typical atrial flutter: Secondary | ICD-10-CM

## 2024-06-05 DIAGNOSIS — I10 Essential (primary) hypertension: Secondary | ICD-10-CM

## 2024-06-05 MED ORDER — APIXABAN 5 MG PO TABS: 5.0000 mg | ORAL_TABLET | Freq: Two times a day (BID) | ORAL | 3 refills | Status: AC

## 2024-06-05 NOTE — Patient Instructions (Signed)
 Medication Instructions:  Your physician recommends that you continue on your current medications as directed. Please refer to the Current Medication list given to you today.  *If you need a refill on your cardiac medications before your next appointment, please call your pharmacy*  Lab Work: BMET, CBC-TODAY If you have labs (blood work) drawn today and your tests are completely normal, you will receive your results only by: MyChart Message (if you have MyChart) OR A paper copy in the mail If you have any lab test that is abnormal or we need to change your treatment, we will call you to review the results.  Follow-Up: At [facility], you and your health needs are our priority.  As part of our continuing mission to provide you with exceptional heart care, our providers are all part of one team.  This team includes your primary Cardiologist (physician) and Advanced Practice Providers or APPs (Physician Assistants and Nurse Practitioners) who all work together to provide you with the care you need, when you need it.  Your next appointment:   1 year(s)  Provider:   You may see Will Gladis Norton, MD or one of the following Advanced Practice Providers on your designated Care Team:   Charlies Arthur, [place] Ozell Jodie Passey, PA-C Suzann Riddle, NP Daphne Barrack, NP Artist Pouch, PA-C

## 2024-06-06 ENCOUNTER — Ambulatory Visit: Payer: Self-pay | Admitting: Student

## 2024-06-06 LAB — BASIC METABOLIC PANEL WITH GFR
BUN/Creatinine Ratio: 27 (ref 12–28)
BUN: 22 mg/dL (ref 8–27)
CO2: 28 mmol/L (ref 20–29)
Calcium: 11 mg/dL — AB (ref 8.7–10.3)
Chloride: 104 mmol/L (ref 96–106)
Creatinine, Ser: 0.81 mg/dL (ref 0.57–1.00)
Glucose: 85 mg/dL (ref 70–99)
Potassium: 5.1 mmol/L (ref 3.5–5.2)
Sodium: 143 mmol/L (ref 134–144)
eGFR: 75 mL/min/1.73 (ref >59)

## 2024-06-06 LAB — CBC
Hematocrit: 44.3 % (ref 34.0–46.6)
Hemoglobin: 14.4 g/dL (ref 11.1–15.9)
MCH: 30.5 pg (ref 26.6–33.0)
MCHC: 32.5 g/dL (ref 31.5–35.7)
MCV: 94 fL (ref 79–97)
Platelets: 207 x10E3/uL (ref 150–450)
RBC: 4.72 x10E6/uL (ref 3.77–5.28)
RDW: 12.1 % (ref 11.7–15.4)
WBC: 7.2 x10E3/uL (ref 3.4–10.8)

## 2024-06-26 ENCOUNTER — Ambulatory Visit (HOSPITAL_BASED_OUTPATIENT_CLINIC_OR_DEPARTMENT_OTHER)
Admission: RE | Admit: 2024-06-26 | Discharge: 2024-06-26 | Disposition: A | Discharge status: Home / Self Care | Source: Ambulatory Visit | Attending: Family Medicine | Admitting: Family Medicine

## 2024-06-26 DIAGNOSIS — M8580 Other specified disorders of bone density and structure, unspecified site: Secondary | ICD-10-CM | POA: Insufficient documentation

## 2024-06-26 DIAGNOSIS — M8588 Other specified disorders of bone density and structure, other site: Secondary | ICD-10-CM | POA: Diagnosis not present

## 2024-06-26 DIAGNOSIS — Z780 Asymptomatic menopausal state: Secondary | ICD-10-CM | POA: Insufficient documentation

## 2024-07-10 DIAGNOSIS — I480 Paroxysmal atrial fibrillation: Secondary | ICD-10-CM | POA: Diagnosis not present

## 2024-07-10 DIAGNOSIS — M8588 Other specified disorders of bone density and structure, other site: Secondary | ICD-10-CM | POA: Diagnosis not present

## 2024-07-10 DIAGNOSIS — I10 Essential (primary) hypertension: Secondary | ICD-10-CM | POA: Diagnosis not present

## 2024-07-21 DIAGNOSIS — H401131 Primary open-angle glaucoma, bilateral, mild stage: Secondary | ICD-10-CM | POA: Diagnosis not present

## 2024-07-31 ENCOUNTER — Other Ambulatory Visit: Payer: Self-pay | Admitting: Family Medicine

## 2024-07-31 DIAGNOSIS — Z1231 Encounter for screening mammogram for malignant neoplasm of breast: Secondary | ICD-10-CM

## 2024-08-28 ENCOUNTER — Ambulatory Visit
Admission: RE | Admit: 2024-08-28 | Discharge: 2024-08-28 | Disposition: A | Source: Ambulatory Visit | Attending: Family Medicine | Admitting: Family Medicine

## 2024-08-28 DIAGNOSIS — Z1231 Encounter for screening mammogram for malignant neoplasm of breast: Secondary | ICD-10-CM
